# Patient Record
Sex: Female | Born: 1970 | Race: Black or African American | Hispanic: No | Marital: Married | Smoking: Never smoker
Health system: Southern US, Community
[De-identification: ages and names within clinical notes are randomized; demographics above are authoritative.]

## PROBLEM LIST (undated history)

## (undated) ENCOUNTER — Inpatient Hospital Stay (HOSPITAL_COMMUNITY): Payer: Self-pay

## (undated) DIAGNOSIS — R51 Headache: Secondary | ICD-10-CM

## (undated) DIAGNOSIS — Z8742 Personal history of other diseases of the female genital tract: Secondary | ICD-10-CM

## (undated) DIAGNOSIS — IMO0002 Reserved for concepts with insufficient information to code with codable children: Secondary | ICD-10-CM

## (undated) DIAGNOSIS — R011 Cardiac murmur, unspecified: Secondary | ICD-10-CM

## (undated) DIAGNOSIS — B9689 Other specified bacterial agents as the cause of diseases classified elsewhere: Secondary | ICD-10-CM

## (undated) DIAGNOSIS — G971 Other reaction to spinal and lumbar puncture: Secondary | ICD-10-CM

## (undated) DIAGNOSIS — N760 Acute vaginitis: Secondary | ICD-10-CM

## (undated) DIAGNOSIS — R6882 Decreased libido: Secondary | ICD-10-CM

## (undated) DIAGNOSIS — D649 Anemia, unspecified: Secondary | ICD-10-CM

## (undated) DIAGNOSIS — T7421XA Adult sexual abuse, confirmed, initial encounter: Secondary | ICD-10-CM

## (undated) DIAGNOSIS — Z8632 Personal history of gestational diabetes: Secondary | ICD-10-CM

## (undated) DIAGNOSIS — R569 Unspecified convulsions: Secondary | ICD-10-CM

## (undated) DIAGNOSIS — G43829 Menstrual migraine, not intractable, without status migrainosus: Secondary | ICD-10-CM

## (undated) DIAGNOSIS — R87619 Unspecified abnormal cytological findings in specimens from cervix uteri: Secondary | ICD-10-CM

## (undated) DIAGNOSIS — Z8639 Personal history of other endocrine, nutritional and metabolic disease: Secondary | ICD-10-CM

## (undated) DIAGNOSIS — Z87440 Personal history of urinary (tract) infections: Secondary | ICD-10-CM

## (undated) DIAGNOSIS — N9089 Other specified noninflammatory disorders of vulva and perineum: Secondary | ICD-10-CM

## (undated) DIAGNOSIS — N914 Secondary oligomenorrhea: Secondary | ICD-10-CM

## (undated) DIAGNOSIS — B373 Candidiasis of vulva and vagina: Secondary | ICD-10-CM

## (undated) DIAGNOSIS — Z8669 Personal history of other diseases of the nervous system and sense organs: Secondary | ICD-10-CM

## (undated) HISTORY — DX: Personal history of other diseases of the nervous system and sense organs: Z86.69

## (undated) HISTORY — DX: Menstrual migraine, not intractable, without status migrainosus: G43.829

## (undated) HISTORY — DX: Personal history of other endocrine, nutritional and metabolic disease: Z86.39

## (undated) HISTORY — DX: Personal history of gestational diabetes: Z86.32

## (undated) HISTORY — DX: Decreased libido: R68.82

## (undated) HISTORY — DX: Anemia, unspecified: D64.9

## (undated) HISTORY — DX: Acute vaginitis: N76.0

## (undated) HISTORY — DX: Other specified noninflammatory disorders of vulva and perineum: N90.89

## (undated) HISTORY — DX: Personal history of other diseases of the female genital tract: Z87.42

## (undated) HISTORY — DX: Personal history of urinary (tract) infections: Z87.440

## (undated) HISTORY — DX: Secondary oligomenorrhea: N91.4

## (undated) HISTORY — DX: Candidiasis of vulva and vagina: B37.3

## (undated) HISTORY — PX: GYNECOLOGIC CRYOSURGERY: SHX857

## (undated) HISTORY — DX: Other specified bacterial agents as the cause of diseases classified elsewhere: B96.89

---
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
## 1995-03-27 DIAGNOSIS — Z87440 Personal history of urinary (tract) infections: Secondary | ICD-10-CM

## 1995-03-27 DIAGNOSIS — B3731 Acute candidiasis of vulva and vagina: Secondary | ICD-10-CM

## 1995-03-27 DIAGNOSIS — B373 Candidiasis of vulva and vagina: Secondary | ICD-10-CM

## 1995-03-27 DIAGNOSIS — N9089 Other specified noninflammatory disorders of vulva and perineum: Secondary | ICD-10-CM

## 1995-03-27 HISTORY — DX: Other specified noninflammatory disorders of vulva and perineum: N90.89

## 1995-03-27 HISTORY — DX: Acute candidiasis of vulva and vagina: B37.31

## 1995-03-27 HISTORY — DX: Candidiasis of vulva and vagina: B37.3

## 1995-03-27 HISTORY — DX: Personal history of urinary (tract) infections: Z87.440

## 1996-01-03 DIAGNOSIS — D649 Anemia, unspecified: Secondary | ICD-10-CM

## 1996-01-03 DIAGNOSIS — N914 Secondary oligomenorrhea: Secondary | ICD-10-CM

## 1996-01-03 HISTORY — DX: Secondary oligomenorrhea: N91.4

## 1996-01-03 HISTORY — DX: Anemia, unspecified: D64.9

## 1998-08-26 ENCOUNTER — Other Ambulatory Visit: Admission: RE | Admit: 1998-08-26 | Discharge: 1998-08-26 | Payer: Self-pay | Admitting: Obstetrics and Gynecology

## 1999-03-27 DIAGNOSIS — Z8639 Personal history of other endocrine, nutritional and metabolic disease: Secondary | ICD-10-CM

## 1999-03-27 DIAGNOSIS — Z8742 Personal history of other diseases of the female genital tract: Secondary | ICD-10-CM

## 1999-03-27 HISTORY — DX: Personal history of other endocrine, nutritional and metabolic disease: Z86.39

## 1999-03-27 HISTORY — DX: Personal history of other diseases of the female genital tract: Z87.42

## 1999-08-31 ENCOUNTER — Other Ambulatory Visit: Admission: RE | Admit: 1999-08-31 | Discharge: 1999-08-31 | Payer: Self-pay | Admitting: Obstetrics and Gynecology

## 1999-09-19 ENCOUNTER — Encounter: Payer: Self-pay | Admitting: Obstetrics and Gynecology

## 1999-09-19 ENCOUNTER — Ambulatory Visit (HOSPITAL_COMMUNITY): Admission: RE | Admit: 1999-09-19 | Discharge: 1999-09-19 | Payer: Self-pay | Admitting: Obstetrics and Gynecology

## 1999-10-23 ENCOUNTER — Emergency Department (HOSPITAL_COMMUNITY): Admission: EM | Admit: 1999-10-23 | Discharge: 1999-10-23 | Payer: Self-pay

## 1999-10-31 ENCOUNTER — Encounter: Payer: Self-pay | Admitting: Family Medicine

## 1999-10-31 ENCOUNTER — Ambulatory Visit (HOSPITAL_COMMUNITY): Admission: RE | Admit: 1999-10-31 | Discharge: 1999-10-31 | Payer: Self-pay | Admitting: Family Medicine

## 2000-03-26 DIAGNOSIS — B9689 Other specified bacterial agents as the cause of diseases classified elsewhere: Secondary | ICD-10-CM

## 2000-03-26 HISTORY — DX: Other specified bacterial agents as the cause of diseases classified elsewhere: B96.89

## 2000-04-05 ENCOUNTER — Other Ambulatory Visit: Admission: RE | Admit: 2000-04-05 | Discharge: 2000-04-05 | Payer: Self-pay | Admitting: Obstetrics and Gynecology

## 2000-07-02 ENCOUNTER — Encounter: Admission: RE | Admit: 2000-07-02 | Discharge: 2000-09-30 | Payer: Self-pay | Admitting: Obstetrics and Gynecology

## 2000-08-21 ENCOUNTER — Encounter: Payer: Self-pay | Admitting: Obstetrics and Gynecology

## 2000-08-21 ENCOUNTER — Inpatient Hospital Stay (HOSPITAL_COMMUNITY): Admission: AD | Admit: 2000-08-21 | Discharge: 2000-08-21 | Payer: Self-pay | Admitting: Obstetrics and Gynecology

## 2000-08-28 ENCOUNTER — Inpatient Hospital Stay (HOSPITAL_COMMUNITY): Admission: AD | Admit: 2000-08-28 | Discharge: 2000-08-31 | Payer: Self-pay | Admitting: Obstetrics and Gynecology

## 2000-10-09 ENCOUNTER — Other Ambulatory Visit: Admission: RE | Admit: 2000-10-09 | Discharge: 2000-10-09 | Payer: Self-pay | Admitting: Obstetrics and Gynecology

## 2005-02-19 ENCOUNTER — Other Ambulatory Visit: Admission: RE | Admit: 2005-02-19 | Discharge: 2005-02-19 | Payer: Self-pay | Admitting: Obstetrics and Gynecology

## 2005-03-26 DIAGNOSIS — R6882 Decreased libido: Secondary | ICD-10-CM

## 2005-03-26 HISTORY — DX: Decreased libido: R68.82

## 2006-02-20 ENCOUNTER — Other Ambulatory Visit: Admission: RE | Admit: 2006-02-20 | Discharge: 2006-02-20 | Payer: Self-pay | Admitting: Obstetrics and Gynecology

## 2006-03-26 DIAGNOSIS — N760 Acute vaginitis: Secondary | ICD-10-CM

## 2006-03-26 HISTORY — DX: Acute vaginitis: N76.0

## 2008-06-09 ENCOUNTER — Encounter: Admission: RE | Admit: 2008-06-09 | Discharge: 2008-06-09 | Payer: Self-pay | Admitting: Obstetrics and Gynecology

## 2008-07-01 ENCOUNTER — Inpatient Hospital Stay (HOSPITAL_COMMUNITY): Admission: RE | Admit: 2008-07-01 | Discharge: 2008-07-04 | Payer: Self-pay | Admitting: Obstetrics and Gynecology

## 2009-10-20 ENCOUNTER — Emergency Department (HOSPITAL_COMMUNITY): Admission: EM | Admit: 2009-10-20 | Discharge: 2009-10-20 | Payer: Self-pay | Admitting: Emergency Medicine

## 2010-06-10 LAB — POCT I-STAT, CHEM 8
BUN: 11 mg/dL (ref 6–23)
Calcium, Ion: 1.12 mmol/L (ref 1.12–1.32)
Chloride: 105 meq/L (ref 96–112)
Creatinine, Ser: 0.9 mg/dL (ref 0.4–1.2)
Glucose, Bld: 110 mg/dL — ABNORMAL HIGH (ref 70–99)
HCT: 40 % (ref 36.0–46.0)
Hemoglobin: 13.6 g/dL (ref 12.0–15.0)
Potassium: 3.4 mEq/L — ABNORMAL LOW (ref 3.5–5.1)
Sodium: 140 meq/L (ref 135–145)
TCO2: 25 mmol/L (ref 0–100)

## 2010-06-10 LAB — CBC
HCT: 38 % (ref 36.0–46.0)
Hemoglobin: 12.6 g/dL (ref 12.0–15.0)
MCH: 27.7 pg (ref 26.0–34.0)
MCHC: 33.2 g/dL (ref 30.0–36.0)
MCV: 83.3 fL (ref 78.0–100.0)
Platelets: 309 10*3/uL (ref 150–400)
RBC: 4.56 MIL/uL (ref 3.87–5.11)
RDW: 13.8 % (ref 11.5–15.5)
WBC: 10.8 10*3/uL — ABNORMAL HIGH (ref 4.0–10.5)

## 2010-06-10 LAB — DIFFERENTIAL
Eosinophils Absolute: 0.1 10*3/uL (ref 0.0–0.7)
Lymphs Abs: 4.8 10*3/uL — ABNORMAL HIGH (ref 0.7–4.0)
Monocytes Relative: 5 % (ref 3–12)
Neutro Abs: 5.2 10*3/uL (ref 1.7–7.7)
Neutrophils Relative %: 48 % (ref 43–77)

## 2010-06-10 LAB — POCT CARDIAC MARKERS
CKMB, poc: <1 ng/mL — ABNORMAL LOW (ref 1.0–8.0)
CKMB, poc: <1 ng/mL — ABNORMAL LOW (ref 1.0–8.0)
Myoglobin, poc: 42.8 ng/mL (ref 12–200)
Myoglobin, poc: 65.8 ng/mL (ref 12–200)
Troponin i, poc: <0.05 ng/mL (ref 0.00–0.09)
Troponin i, poc: <0.05 ng/mL (ref 0.00–0.09)

## 2010-07-05 LAB — CBC
Hemoglobin: 10.2 g/dL — ABNORMAL LOW (ref 12.0–15.0)
Hemoglobin: 8.3 g/dL — ABNORMAL LOW (ref 12.0–15.0)
MCHC: 32.3 g/dL (ref 30.0–36.0)
MCHC: 32.8 g/dL (ref 30.0–36.0)
MCV: 84.9 fL (ref 78.0–100.0)
MCV: 85.4 fL (ref 78.0–100.0)
RBC: 2.95 MIL/uL — ABNORMAL LOW (ref 3.87–5.11)
RBC: 3.73 MIL/uL — ABNORMAL LOW (ref 3.87–5.11)
WBC: 10 10*3/uL (ref 4.0–10.5)
WBC: 10.6 10*3/uL — ABNORMAL HIGH (ref 4.0–10.5)

## 2010-07-05 LAB — RPR: RPR Ser Ql: NONREACTIVE

## 2010-07-05 LAB — BASIC METABOLIC PANEL
CO2: 23 mEq/L (ref 19–32)
Chloride: 106 mEq/L (ref 96–112)
Creatinine, Ser: 0.62 mg/dL (ref 0.4–1.2)
GFR calc Af Amer: >60 mL/min (ref >60)
Potassium: 3.7 mEq/L (ref 3.5–5.1)
Sodium: 135 mEq/L (ref 135–145)

## 2010-07-05 LAB — GLUCOSE, CAPILLARY: Glucose-Capillary: 65 mg/dL — ABNORMAL LOW (ref 70–99)

## 2010-07-05 LAB — CCBB MATERNAL DONOR DRAW

## 2010-08-08 NOTE — Discharge Summary (Signed)
Lori Watts, Lori Watts           ACCOUNT NO.:  [id number]   MEDICAL RECORD NO.:  [id number]          PATIENT TYPE:  INP   LOCATION:  9103                          FACILITY:  [facility]   PHYSICIAN:  Osborn Coho, M.D.   DATE OF BIRTH:  Mar 15, 1971   DATE OF ADMISSION:  07/01/2008  DATE OF DISCHARGE:  07/04/2008                               DISCHARGE SUMMARY   ADMITTING DIAGNOSES:  1. Intrauterine pregnancy at 51 weeks' gestation.  2. Previous cesarean section with desire for repeat.  3. Gestational diabetes.  4. Advanced maternal age.   DISCHARGE DIAGNOSES:  1. Intrauterine pregnancy at 76 weeks' gestation.  2. Previous cesarean section with desire for repeat.  3. Gestational diabetes.  4. Advanced maternal age.  5. Status post a repeat low transverse cesarean section with findings,      viable female infant born on July 01, 2007 at 1406 p.m.  A female      weighing 7 pounds 12 ounces (3500g) length 19.75 inches, Apgars 8      at 1 minute and 9 at 5 minutes.  6. Postpartum anemia.  7. Bottle feeding with some breastfeeding.  8. Headache x24 hours without relief with pain medications with a      history of migraines.  9. Gestational diabetes with a normal fasting blood sugar this      morning.   HOSPITAL PROCEDURES:  1. Spinal anesthesia.  2. Repeat low transverse cesarean section.  3. Lysis of adhesions.  4. Repair of the uterine fundal defect.  5. Subfascial drain placement.   Lori Watts is a 40 year old gravida 3, para 1-0-0-3, who presented for  a repeat low transverse cesarean section on date of admission.  Pregnancy had been followed by Dr. Pennie Rushing.  Pregnancy remarkable for:  1. Advanced maternal age.  2. Long cycles.  3. Migraines.  4. Previous C-section x2.  5. History of gestational diabetes.  6. History of urinary tract infections.  7. History of cryo.  8. 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  9. Medication allergies.  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   On her date of admission, she was afebrile.  Her vital signs were  stable.  Fetal heart rate was in 130s, positive fetal movement.  Blood  glucose that morning was 53.  Denies any dizziness.  After admission,  the patient did continue to verbalize desire for a repeat low transverse  cesarean section and was prepped and taken to the OR, where procedure  was performed by Dr. Dierdre Forth with Eulogio Bear, CNM assisting.  A repeat low transverse cesarean section was performed under spinal  anesthesia.  She also had procedures of lysis of adhesions, repair of  uterine fundal defect, as well as subfascial drain placement.  Estimated  blood loss was approximately 1500 mL.  Please see Dr. Lilian Coma  operative note for any further notations regarding the operation.  The  patient was taken to the recovery room after surgery in satisfactory  condition and the infant did go to Full-Term Nursery.  By postoperative  day #1; the patient stated that she was very weak, she had  some  dizziness with out of bed x2, no syncope, slight nausea, was tolerating  a regular diet, positive flatus, no bowel movement.  She was  breastfeeding as well as bottle feeding and did decline blood  transfusion.  She was afebrile.  Vital signs were stable.  Labs on  postop day #1; white count was 10.6, that was up from 10.  On admission,  hemoglobin was down to 8.3 from 10.2.  On admission, hematocrit was down  to 25.2 from 31.7.  Platelets were 191, which was down from 239.  Her  physical exam was within normal limits.  She was generally weak, but  alert and oriented x3.  JP drain had bloody drainage.  She had had 70 mL  out since her surgery.  Lungs were clear bilaterally.  Abdomen, soft,  appropriately tender.  Fundus was firm below umbilicus.  Her postop  dressing was clean, dry, and intact.  She had a scant rubra lochia, no  edema, and negative Homans.  The patient was started on Nu-Iron b.i.d.  She did have  orthostatic vital signs done that day, which were as  follows.  Lying down; blood pressure 87/58, heart rate 58.  Sitting was  98/62, heart rate 66.  Standing was 99/65 and heart rate was 76.  Routine post C-section care continued.  By day #2, she reported feeling  lot better than the previous day.  No further dizziness, and has  complained of fatigue.  No syncope.  She was voiding without difficulty,  tolerating regular diet.  Still negative BM, but positive flatus.  Bottle feeding primarily.  She remained afebrile, and vital signs were  stable.  Physical exam was within normal limits.  Abdomen was soft,  appropriately tender.  Did have bowel sounds present x4 quadrants.  Fundus was firm below umbilicus.  Abdominal dressing remained clean,  dry, and intact.  She had about 80 mL of output from her JP drain, the  previous 24 hours.  Lochia remained unchanged and extremities were  unchanged.  Postpartum care continued with anticipation of discharge the  next day.  On postoperative day #3; she was doing okay, but ready for  discharge.  She did complain of headaches since yesterday that was not  relieved by Motrin or Percocet.  She does have a history of migraines.  She is primarily bottle feeding, reported good latch on the left but  that baby does not latch very well on the right, continued tolerating  her diet and voiding spontaneously.  She does report that she desires a  future pregnancy and plan will be Depo before discharge today and then  discuss oral contraceptive pills with Dr. Pennie Rushing at her postpartum  appointment.  She has continued weakness and fatigue, but no syncope.  Still no bowel movement and that has been since this past Wednesday.  Vital signs remained stable.  She is afebrile.  Her CBG this morning is  65.  Her JP drainage drained approximately 52 mL over the last 24 hours  with serosanguineous drainage.  Her physical exam was still within  normal limits.  JP drain was  discontinued without difficulty, tip  visualized.  She has Steri-Strips which are clean, dry and intact across  the incision.  Her abdomen is soft.  It is appropriately tender.  Fundus  is firm, -2U, and her extremities are within normal limits.  The patient  was deemed to received full benefit of her hospital stay and is  discharged home  stable condition on postoperative day #3.  She is to  receive an anesthesia consult prior to discharge to also assess her  headache to rule out spinal headache or any other anesthesia  complications.   DISCHARGE INSTRUCTIONS:  Per [facility] pamphlet.  Warning signs and symptoms  to report were reviewed.   DISCHARGE FOLLOWUP:  Occur in 6 weeks or p.r.n. at [facility].  She does report she already has an appointment made in May with Dr.  Pennie Rushing.   DISCHARGE MEDICATIONS:  Motrin 600 mg p.o. q.6 h. p.r.n. pain.  Percocet  5/325 one to 2 tablets p.o. q.4-6 h. p.r.n. moderate-to-severe pain.  Iron supplement 1 tablet p.o. b.i.d. with meals.  Colace 2 tablets p.o.  daily while on  pain medication.  Prenatal vitamin 1 tablet p.o. daily.  She does not  intend to continue her calcium supplement each day.  Also encouraged to  obtain MiraLax to take if no bowel movements for 3 days,  otherwise to  get back today as well.  She is to receive Depo-Provera 150 mg IM x1  prior to discharge.      Candice Greencastle, [place]      Osborn Coho, M.D.  Electronically Signed    CHS/MEDQ  D:  07/04/2008  T:  07/05/2008  Job:  [id number]

## 2010-08-08 NOTE — Op Note (Signed)
Lori Watts, Lori Watts           ACCOUNT NO.:  [id number]   MEDICAL RECORD NO.:  [id number]          PATIENT TYPE:  INP   LOCATION:  9103                          [facility]   PHYSICIAN:  Hal Morales, M.D.DATE OF BIRTH:  1970-08-08   DATE OF PROCEDURE:  07/01/2008  DATE OF DISCHARGE:                               OPERATIVE REPORT   PREOPERATIVE DIAGNOSES:  1. Intrauterine pregnancy at term.  2. Prior cesarean section x2.  3. Desires repeat cesarean section.  4. Gestational diabetes.   POSTOPERATIVE DIAGNOSES:  1. Intrauterine pregnancy at term.  2. Prior cesarean section x2.  3. Desires repeat cesarean section.  4. Gestational diabetes.  5. Extensive pelvic adhesions.   PROCEDURES:  1. Repeat low-transverse cesarean section.  2. Lysis of adhesions.  3. Repair of uterine fundal defect.  4. Subfascial drain placement.   SURGEON:  Hal Morales, MD   FIRST ASSISTANT:  Eulogio Bear, CNM   ANESTHESIA:  Spinal.   ESTIMATED BLOOD LOSS:  1500 mL.   COMPLICATIONS:  None.   FINDINGS:  The patient was delivered of a female infant whose name is  Venia Carbon, weighing 7 pounds 13 ounces with Apgars of 8 and 9 at 1 and 5  minutes respectively.  The placenta contained an eccentrically inserted  3-vessel cord.  The uterus was densely adherent to the anterior  abdominal wall.  The tubes and ovaries could not be identified.   DESCRIPTION OF PROCEDURE:  The patient was taken to the operating room,  after appropriate identification, placed on the operating table.  After  the placement of spinal anesthetic, she was placed in the supine  position with a left lateral tilt.  The abdomen and perineum were  prepped with multiple layers of Betadine and a Foley catheter inserted  into the bladder and connected to straight drainage.  The abdomen was  draped as a sterile field.  After assurance of adequate surgical  anesthesia, the suprapubic region at the site of the previous  cesarean  section incision was then infiltrated with 20 mL of 0.25% Marcaine.  An  incision was made at that site and the abdomen opened in layers.  The  fascia was incised on either side and the rectus muscles bluntly  dissected off the abdominal fascia.  An attempt was made to enter the  peritoneum and it was noted that the peritoneum was densely adherent to  the anterior uterus along its entire uterine surface.  At that point, a  site of entry into the peritoneal cavity just above the bladder was  identified and widened.  The bladder blade was placed.  Lysis of  adhesions across the anterior lower uterine segment was undertaken to  identify the bladder reflection.  The bladder reflection was incised and  the bladder bluntly dissected off the anterior lower uterine segment.  The lower uterine segment was then incised transversely and that  incision taken laterally on either side bluntly.  The infant was  delivered from the occiput transverse position with the aid of a kiwi  vacuum extractor.  The infant was noted to have a nuchal cord  and body  cord.  The nares and pharynx were suctioned and the cord clamped and  cut.  The infant was handed off to the awaiting pediatricians.  The  placenta was initially allowed to detach, but the last attachment of the  placenta broken down bluntly with the surgeon's hand.  The placenta was  then removed from the operative field and the uterine cavity curetted  with a banjo curette revealing no additional portions of placenta.  The  uterine incision was closed with running interlocking suture of 0  Vicryl.  An imbricating suture of 0 Vicryl was placed.  Hemostasis there  was noted to be adequate; however, there was noted to be a significant  defect in the anterior fundus of the uterus, which had been densely  adherent to the anterior abdominal wall.  The defect edges were freed  from the overlying anterior abdominal wall and the edges reapproximated   with running suture of 0 Vicryl, then a layer of figure-of-eight sutures  of 0 Vicryl to close the defect completely and allow for adequate  hemostasis.  Copious irrigation was carried out and it was noted that in  order to achieve the delivery, small incisions in the medial surfaces of  each rectus muscle had been made.  These were reapproximated with figure-  of-eight sutures of 0 Vicryl.  The abdominal fascia was then closed with  a running suture of 0 Vicryl after the placement of a subfascial 7-mm  Jackson-Pratt drain through a stab wound in the right lower quadrant.  The fascia was further closed with figure-of-eight sutures on either  side of midline for reinforcement.  The subcutaneous tissue was  irrigated and made hemostatic with Bovie cautery.  A 10 mL of 0.25%  Marcaine was injected into the subcutaneous tissue and a subcuticular  suture of 3-0 Monocryl used to close the skin incision.  Steri-Strips  were applied and a sterile dressing applied.  The grenade was attached  to the 7-mm Jackson-Pratt drain and it was sewn in with a suture of 0  silk.  The patient was then taken from the operating room to the  recovery room in satisfactory condition having tolerated the procedure  well with sponge and instrument counts correct.  The infant went to the  full-term nursery.      Hal Morales, M.D.  Electronically Signed     VPH/MEDQ  D:  07/01/2008  T:  07/02/2008  Job:  [id number]
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
## 2010-08-11 NOTE — Discharge Summary (Signed)
[facility]  Patient:    Lori Watts, Lori Watts                  MRN: [id number] Adm. Date:  [id number] Disc. Date: 08/31/00 Attending:  Shaune Spittle Dictator:   Wynelle Bourgeois, C.N.M.                           Discharge Summary  ADMISSION DIAGNOSES:          1. Intrauterine pregnancy at term.                               2. Prior cesarean section, desires repeat.  DISCHARGE DIAGNOSES:          1. Intrauterine pregnancy at term.                               2. Prior cesarean section, desires repeat.                               3. Status post repeat low transverse cesarean                                  section, female infant weighing 8 pounds                                  14 ounces, Apgars 9 and 9.  HOSPITAL PROCEDURES:          1. Repeat low transverse cesarean section.                               2. Spinal anesthesia.  COMPLICATIONS:                Bleeding from uterine adhesion sites.  HOSPITAL COURSE:              The patient is a G3, para 0-2-0-2 at term who presented for scheduled C-section as an elective procedure. She was admitted on August 28, 2000  to the operating room and received spinal anesthesia. She underwent a low transverse cesarean section by Dr. Pennie Rushing for a viable female infant weighing 8 pounds 14 ounces, Apgars 9 and 9. She did well in recovery and the only complication during the surgery was bleeding from uterine adhesion sites.  Her postoperative course was essentially unremarkable. On postoperative day #1, she was ambulating well, tolerating a diet, and vital signs were stable. Chest was clear. Heart: regular rate and rhythm. Beasts soft and nontender. Abdominal incision was clear with a JP drain which had scant serosanguineous drainage. She had some postpartum anemia with a hemoglobin of 8.5. She was breast-feeding well. Her second and third postoperative days were uneventful except for onset of a severe  headache on postoperative day #2 and #3 which will be evaluated by anesthesia prior to discharge. She is breast-feeding well. Her vital signs have been stable. Her maximum temperature has been 98.6, blood pressure is 100-120/68-84. Physical exam is within normal limits. Incision is clean and dry. Staples will be removed today. Her JP drain will be removed today. She  is voiding sufficiently. Her extremities are within normal limits, and on postoperative day #3 she was deemed to have received full benefit of her hospital stay and was discharged home.  DISCHARGE LABORATORIES:       Her discharge labs were remarkable for hemoglobin of 8.5. Other labs were within normal limits.  DISCHARGE MEDICATIONS:        1. Micronor for contraception.                               2. Motrin 600 mg p.o. q.6h. p.r.n. cramping.                               3. Tylox one to two p.o. q.4h. p.r.n. pain.                               4. Over-the-counter iron sulfate twice a day                                  at home.  DISCHARGE INSTRUCTIONS:       Her discharge instructions are per [facility] handout.  DISCHARGE FOLLOWUP:           The patient is to follow up in six weeks or p.r.n. at [facility] as needed. DD:  08/31/00 TD:  08/31/00 Job: [id number]

## 2010-08-11 NOTE — H&P (Signed)
[facility]  Patient:    Lori Watts                  MRN: [id number] Adm. Date:  [id number] Attending:  Shaune Spittle Dictator:   Cam Hai, C.N.M.                         History and Physical  DATE OF BIRTH:                1970-04-27.  HISTORY:                      Ms. Lori Watts is a gravida 2, para 0-2-0-2, at term who presents for a scheduled cesarean section as an elective procedure. Her pregnancy has been followed by the [facility] OB/GYN M.D. service and has been remarkable for (1) irregular cycles, (2) history of twins, (30 history of preterm delivery, (4) history of HSV, (5) history of abnormal Pap smear and cryosurgery, (6) first trimester spotting, (7) secondary infertility, an d (8) gestational diabetes mellitus.  PRENATAL LABORATORY DATA:     Blood type A positive, antibody screen negative, sickle cell trait negative, RPR nonreactive, rubella immune, hepatitis B surface antigen negative, Pap smear within normal limits, gonorrhea negative, Chlamydia negative, maternal serum alphafetoprotein within normal limits. One-hour glucola was 148 and her three-hour glucose tolerance test was: fasting blood sugar 97, one-hour 248, two-hour 279, and three-hour 175 which is abnormal.  HISTORY OF PRESENT PREGNANCY: She presented for care at approximately [redacted] weeks gestation where she had moved from [place] and was transferring her OB care. She had an ultrasound at approximately [redacted] weeks gestation which gave her a new estimated date of confinement of September 10, 2000. At approximately [redacted] weeks gestation she decided she would like a repeat cesarean section, and she signed the consent form. At 27-1/2 weeks she had her one-hour glucola testing which was abnormal and her three-hour glucola tolerance test was scheduled for 29 weeks and was abnormal as well. At 32 weeks she had a urinary tract infection and the rest of her prenatal  care routine.  OBSTETRIC HISTORY:            In November of 1990, she had a delivery of twins, first twin was a female infant, delivered vaginally named Lori Watts with no anesthesia after four hours in labor at 34-1/[redacted] weeks gestation; this infant weighed 4 pounds even. The other infant was a female who was delivered by C-section under general anesthesia due to a cord around his neck. His named was Lori Watts and he weighed 4 pounds and 9 ounces. Her second pregnancy is the present pregnancy. She reports being anemic with the first pregnancy.  PAST MEDICAL HISTORY:         She took oral contraceptives in the past but she stopped in August of 2000. She had an abnormal Pap smear and cryosurgery in 1991. She reports secondary infertility after stopping Depo-Provera. She had gonorrhea in 1987 and HSV diagnosed in 1997. She reports having infrequent yeast infections. She reports having had the usual childhood illnesses and has no medication allergies. She reports having recurrent constipation with hemorrhoids, frequent urinary tract infections, and migraine headaches. She had knee surgery bilaterally in 1998, her C-section in 1990, and cryosurgery in 1991.  FAMILY HISTORY:               Significant for maternal grandmother with myocardial infarction, mother and  maternal grandmother with hypertension, mother with Type 2 diabetes and a first cousin with hyperthyroidism, maternal grandmother with a history of a CVA, multiple relatives with drug addiction.  GENETIC HISTORY:              Significant for maternal first cousin with cerebral palsy, two of her maternal grandmothers sisters had twins, the patient has had twins.  SOCIAL HISTORY:               She is married to Rankin who is involved and supportive. They are of the [facility] faith. They both are college educated, and deny any alcohol, smoking, or illicit drug use with the pregnancy.  OBJECTIVE DATA:  VITAL SIGNS:                  Vital signs  are stable, she is afebrile.  HEENT:                        Within normal limits.  CHEST:                        Clear to auscultation.  HEART:                        Regular to rate and rhythm.  BREASTS:                      Soft and nontender.  ABDOMEN:                      Gravid in contour with rare uterine contractions.  FETAL HEART RATE:             Fetal heart rate is reactive and reassuring.  ASSESSMENT:                   1. Intrauterine pregnancy at term.                               2. Desires repeat cesarean section.                               3. Group B streptococcus negative.  PLAN:                         Proceed with repeat cesarean section by Dr. Dierdre Forth. DD:  08/29/00 TD:  08/29/00 Job: [id number]

## 2010-08-11 NOTE — Op Note (Signed)
[facility]  Patient:    Lori Watts, Lori Watts Visit Number: [id number] MRN: [id number]          Service Type: OBS Location: 910A [id number] [id number] Attending Physician:  Shaune Spittle Proc. Date: 08/28/00 Admit Date:  08/28/2000                             Operative Report  PREOPERATIVE DIAGNOSIS:       Intrauterine pregnancy at term, prior cesarean section with desire for repeat cesarean section.  POSTOPERATIVE DIAGNOSIS:      Intrauterine pregnancy at term, prior cesarean section with desire for repeat cesarean section. Plus macrosomia and extensive pelvic adhesions.  OPERATION:                    Repeat low transverse cesarean section.  SURGEON:                      Vanessa P. Pennie Rushing, M.D.  ASSISTANT:                    Mack Guise, C.N.M.  ANESTHESIA:                   Spinal.  ESTIMATED BLOOD LOSS:         1000 cc.  COMPLICATIONS:                Significant bleeding from uterine serosal sites where adhesions were lysed.  FINDINGS:                     The patient was delivered of a female infant weighing 8 pounds 14 ounces with Apgars of 9 and 9 at one and five minutes, respectively.  The tubes and ovaries could not be evaluated because of the dense adhesions between the anterior uterus and the anterior abdominal wall.  DESCRIPTION OF PROCEDURE:     The patient was taken to the operating room after appropriate identification and placed on the operating table.  After placement of a spinal anesthetic she was placed in the supine position with a left lateral tilt.  The abdomen and perineum were prepped with multiple layers of Betadine and a Foley catheter inserted into the bladder under sterile conditions and connected to straight drainage.  The abdomen was draped as a sterile field.  After assurance of adequate anesthesia a transverse incision was made at the site of the previous cesarean section incision and the abdomen opened in  layers.  The peritoneum was entered with some difficulty because of significant scarring and identification of the peritoneum was difficult.  Once the peritoneum had been entered, lysis of adhesions was undertaken to allow access to the lower uterine segment.  Once extensive lysis of adhesions had occurred, the bladder blade was placed and the uterus incised approximately 1 cm above the uterovesical fold.  The infant was delivered from the occipitotransverse position with the aid of a Mity-Vac vacuum extractor and after having the nares and pharynx suctioned and the cord clamped and cut, was handed off to the awaiting pediatricians.  The appropriate cord blood was drawn and the placenta noted to separate from the uterus and was removed from the uterus with gentle traction.  The uterine incision was closed with a running interlocking suture of 0 Vicryl.  An imbricating suture of 0 Vicryl was placed for adequate hemostasis at the incision line.  It  was noted at that time, however, that a number of the areas on the anterior uterus where the serosa had been dissected off the anterior abdominal wall were bleeding significantly.  Numerous figure-of-eight sutures of 0 Vicryl were placed initially with adequate hemostasis.  Bleeding from other areas of lysis of adhesions required the use of Avitene for hemostasis.  Copious irrigation had been carried out prior to this and once hemostasis was noted to be adequate, the abdominal peritoneum was brought together in the midline in a figure-of-eight suture and the rectus muscles brought together in the midline with figure-of-eight suture.  A subfascial Jackson-Pratt drain was then placed through the fascia and the skin with the fascia being closed subsequent to that in a running fashion with 0 Vicryl and then reinforced on either side of the midline with figure-of-eight sutures of 0 Vicryl.  The subcutaneous tissue was noted to be hemostatic and was  irrigated.  Skin staples were applied to the skin incision.  The Jackson-Pratt drain was sewn in with a suture of 2-0 silk.  A sterile dressing was applied and the patient was taken from the operating room to the recovery room in satisfactory condition having tolerated the procedure well with sponge and instrument counts correct.  The infant went to the fullterm nursery. Attending Physician:  Shaune Spittle DD:  08/28/00 TD:  08/28/00 Job: [id number]

## 2011-08-21 ENCOUNTER — Encounter (HOSPITAL_COMMUNITY): Payer: Self-pay | Admitting: *Deleted

## 2011-08-21 ENCOUNTER — Inpatient Hospital Stay (HOSPITAL_COMMUNITY)
Admission: AD | Admit: 2011-08-21 | Discharge: 2011-08-21 | Disposition: A | Discharge status: Home / Self Care | Payer: 59 | Source: Ambulatory Visit | Attending: Obstetrics and Gynecology | Admitting: Obstetrics and Gynecology

## 2011-08-21 DIAGNOSIS — N925 Other specified irregular menstruation: Secondary | ICD-10-CM | POA: Insufficient documentation

## 2011-08-21 DIAGNOSIS — N949 Unspecified condition associated with female genital organs and menstrual cycle: Secondary | ICD-10-CM | POA: Insufficient documentation

## 2011-08-21 DIAGNOSIS — R109 Unspecified abdominal pain: Secondary | ICD-10-CM | POA: Insufficient documentation

## 2011-08-21 DIAGNOSIS — N938 Other specified abnormal uterine and vaginal bleeding: Secondary | ICD-10-CM | POA: Insufficient documentation

## 2011-08-21 DIAGNOSIS — O200 Threatened abortion: Secondary | ICD-10-CM

## 2011-08-21 HISTORY — DX: Reserved for concepts with insufficient information to code with codable children: IMO0002

## 2011-08-21 HISTORY — DX: Unspecified abnormal cytological findings in specimens from cervix uteri: R87.619

## 2011-08-21 HISTORY — DX: Headache: R51

## 2011-08-21 HISTORY — DX: Adult sexual abuse, confirmed, initial encounter: T74.21XA

## 2011-08-21 HISTORY — DX: Other reaction to spinal and lumbar puncture: G97.1

## 2011-08-21 LAB — WET PREP, GENITAL
Clue Cells Wet Prep HPF POC: NONE SEEN
Trich, Wet Prep: NONE SEEN

## 2011-08-21 LAB — CBC
Hemoglobin: 11.1 g/dL — ABNORMAL LOW (ref 12.0–15.0)
MCV: 80.3 fL (ref 78.0–100.0)
Platelets: 384 10*3/uL (ref 150–400)
RBC: 4.26 MIL/uL (ref 3.87–5.11)
WBC: 10.4 10*3/uL (ref 4.0–10.5)

## 2011-08-21 LAB — HCG, QUANTITATIVE, PREGNANCY: hCG, Beta Chain, Quant, S: 5 m[IU]/mL — ABNORMAL HIGH (ref <5)

## 2011-08-21 MED ORDER — IBUPROFEN 200 MG PO TABS: 600.0000 mg | ORAL_TABLET | Freq: Four times a day (QID) | ORAL | Status: AC | PRN

## 2011-08-21 MED ORDER — IBUPROFEN 200 MG PO TABS: 600.0000 mg | ORAL_TABLET | Freq: Four times a day (QID) | ORAL | Status: DC | PRN

## 2011-08-21 MED ORDER — KETOROLAC TROMETHAMINE 30 MG/ML IJ SOLN
30.0000 mg | Freq: Once | INTRAMUSCULAR | Status: AC
  Administered 2011-08-21: 30 mg via INTRAVENOUS
  Filled 2011-08-21: qty 1
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
## 2011-08-21 NOTE — MAU Note (Signed)
Cramping started 2 days ago, woke up this morning had bleeding like a period.  First appt in office 06/13
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
## 2011-08-28 ENCOUNTER — Ambulatory Visit (INDEPENDENT_AMBULATORY_CARE_PROVIDER_SITE_OTHER): Payer: 59 | Admitting: Obstetrics and Gynecology

## 2011-08-28 ENCOUNTER — Encounter: Payer: Self-pay | Admitting: Obstetrics and Gynecology

## 2011-08-28 VITALS — BP 114/72 | Ht 66.0 in | Wt 166.0 lb

## 2011-08-28 DIAGNOSIS — O039 Complete or unspecified spontaneous abortion without complication: Secondary | ICD-10-CM

## 2011-08-28 DIAGNOSIS — N939 Abnormal uterine and vaginal bleeding, unspecified: Secondary | ICD-10-CM

## 2011-08-28 DIAGNOSIS — N898 Other specified noninflammatory disorders of vagina: Secondary | ICD-10-CM

## 2011-08-28 NOTE — Progress Notes (Signed)
Pt seen at [facility] 08/21/11 for heavy bleeding and clotting pt states positive preg test was told at MAU having a miscarriage f/u with obgyn pt states stop bleeding 08/26/12.  Pt without c/o no abdominal pain or any further bleeding.   BP 114/72  Ht 5\' 6"  (1.676 m)  Wt 166 lb (75.297 kg)  BMI 26.79 kg/m2  LMP 07/11/2011 Physical Examination: General appearance - alert, well appearing, and in no distress and crying Heart - normal rate and regular rhythm Abdomen - soft, nontender, nondistended, no masses or organomegaly Pelvic - normal external genitalia, vulva, vagina, cervix, uterus and adnexa, VULVA: normal appearing vulva with no masses, tenderness or lesions, VAGINA: normal appearing vagina with normal color and discharge, no lesions, CERVIX: normal appearing cervix without discharge or lesions, long and closed, UTERUS: uterus is normal size, shape, consistency and nontender, ADNEXA: normal adnexa in size, nontender and no masses Neurological - alert, oriented, normal speech, no focal findings or movement disorder noted Exam c/w SAB BHCG 5 on 5/28 recheck today RT 10/13 for AEX

## 2011-08-30 ENCOUNTER — Telehealth: Payer: Self-pay

## 2011-08-30 DIAGNOSIS — Z3490 Encounter for supervision of normal pregnancy, unspecified, unspecified trimester: Secondary | ICD-10-CM

## 2011-08-30 NOTE — Telephone Encounter (Signed)
Spoke with pt rgd labs informed quant increased still pregnant need quant every week until 1500 then [place] pt has appt 09/05/11 at 9:00 for lab visit pt voice understanding

## 2011-08-30 NOTE — Telephone Encounter (Signed)
Message copied by Rolla Plate on Thu Aug 30, 2011 11:17 AM ------      Message from: Jaymes Graff      Created: Wed Aug 29, 2011 11:48 PM       Please make sure patient has an appointment to talk about her results. please tell pt her lab has increased, so she is  pregnant.   She needs a bhcg q week until it rises to 1500.  Once 1500 or greater, she needs an appt with an [place].  If she has any heavy bleeding or pain she should call the office.

## 2011-09-05 ENCOUNTER — Other Ambulatory Visit: Payer: 59

## 2011-09-06 ENCOUNTER — Telehealth: Payer: Self-pay | Admitting: Obstetrics and Gynecology

## 2011-09-06 NOTE — Telephone Encounter (Signed)
Niccole/call bck

## 2011-09-10 NOTE — Telephone Encounter (Signed)
Hey Dr.D can you call this pt please thank you

## 2011-09-10 NOTE — Telephone Encounter (Signed)
I reviewed the labs with the pt.  She understands that her labs are c/w a miscarriage and she should just have a follow up visit with me

## 2011-10-04 ENCOUNTER — Encounter: Payer: Self-pay | Admitting: Obstetrics and Gynecology

## 2011-10-04 ENCOUNTER — Ambulatory Visit (INDEPENDENT_AMBULATORY_CARE_PROVIDER_SITE_OTHER): Payer: 59 | Admitting: Obstetrics and Gynecology

## 2011-10-04 VITALS — BP 112/62 | Temp 98.0°F | Ht 65.0 in | Wt 167.0 lb

## 2011-10-04 DIAGNOSIS — Z98891 History of uterine scar from previous surgery: Secondary | ICD-10-CM | POA: Insufficient documentation

## 2011-10-04 DIAGNOSIS — N912 Amenorrhea, unspecified: Secondary | ICD-10-CM

## 2011-10-04 DIAGNOSIS — O039 Complete or unspecified spontaneous abortion without complication: Secondary | ICD-10-CM

## 2011-10-04 DIAGNOSIS — Z9889 Other specified postprocedural states: Secondary | ICD-10-CM

## 2011-10-04 DIAGNOSIS — G43829 Menstrual migraine, not intractable, without status migrainosus: Secondary | ICD-10-CM

## 2011-10-04 LAB — POCT URINE PREGNANCY: Preg Test, Ur: NEGATIVE

## 2011-10-04 MED ORDER — MEDROXYPROGESTERONE ACETATE 5 MG PO TABS: 5.0000 mg | ORAL_TABLET | Freq: Every day | ORAL | Status: DC

## 2011-10-04 NOTE — Patient Instructions (Addendum)
If no menses by July 27, do a home pregnancy test.  If that is negative, take Provera 5 mg daily by mouth for 10 days. If no menses by 2 weeks after completion of the Provera please call our office   Placenta Previa Placenta previa is a condition in which the placenta has grown low in the womb (uterus). This is a condition in which the organ which connects the fetus to the mother's uterus (placenta) is low in the opening in the uterus (cervix). It can partially or completely cover the cervix. The cause of this is unknown. It is more common with multiple births or twins. SYMPTOMS  The main symptom or sign of placenta previa is vaginal bleeding. The bleeding can be mild to very heavy. This condition can be very serious for the mother and baby. Often there are no symptoms with placenta previa. Sometimes if the location of the placenta is very low it will become partially detached and cause bleeding. This may be simply a marginal sinus separation of the placenta. This is a separation of the vessels from the wall of the uterus. This may cause no further problems other than mild anxiety. There is an increase risk of intrauterine growth restriction (IUGR) with placenta previa because of the abnormal placement of the placenta. DIAGNOSIS  The diagnosis is usually made by ultrasound exam of the uterus. There may be a careful vaginal exam to see the cervix. The patient will be prepared for a Cesarean section immediately if necessary. TREATMENT  Treatment for placenta previa is usually bed rest in the hospital or at home. You may be given medication to stop contractions. Contractions can increase bleeding. Your doctor may take fluid from the baby's sac (amniocentesis) to see if the baby's lungs are mature enough for a C-section. A blood transfusion may be necessary if you have a low blood count. No further treatment may be needed when placenta previa is present in small degrees. Early placenta previa may resolve on  it's own. The placenta moves higher in the birth canal as pregnancy progresses. In this case the placenta no longer is an obstruction to birth. The position of the placenta may need to be reconfirmed during pregnancy. This can be done with an ultrasound exam of the belly(abdomen). Call your caregiver immediately if blood loss is severe. Immediate fluid or blood replacement may be necessary. With complete placenta previa, the only way to safely deliver the baby is by Cesarean section. HOME CARE INSTRUCTIONS   Follow your caregiver's advice about bed rest.   Take any iron pills or other medications your doctor gives to you.   No bending or lifting.   Do not have sexual intercourse.   Do not put anything in your vagina (tampons or vaginal creams). If you are bleeding, use sanitary pads.   Keep your doctors appointments as scheduled. Not keeping the appointment could result in a chronic or permanent injury, pain, disability and injury or death to you or your unborn baby. If there is any problem keeping the appointment, you must call back to this facility for assistance.  SEEK IMMEDIATE MEDICAL CARE IF:   You have increased bleeding.   You have fainting episodes or feel lightheaded.   You develop abdominal pain.   You can no longer feel normal fetal or baby movements.   You develop uterine contractions.  Document Released: 03/12/2005 Document Revised: 03/01/2011 Document Reviewed: 10/24/2007 [facility] Patient Information 2012 [place], [place].

## 2011-10-04 NOTE — Progress Notes (Signed)
Subjective Pt here to f/u on Miscarriage from 08/21/2011. Not had cycle since then.  No cramping or abd pain.  Last pap 01/22/2011 The patient states that she wants a total of 7 children. She currently has 4 children having had 3 prior cesarean sections. She asks questions about the risks of more cesarean sections. She also asks about intentional conception of multiple. 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 She plans if she is able to conceive to take the semester off from law school to deal with what will be a high risk pregnancy.  Objective: BP 112/62  Temp 98 F (36.7 C) (Oral)  Ht 5\' 5"  (1.651 m)  Wt 167 lb (75.751 kg)  BMI 27.79 kg/m2  LMP 07/11/2011 Pelvic:Pelvic exam: VULVA: normal appearing vulva with no masses, tenderness or lesions, VAGINA: normal appearing vagina with normal color and discharge, no lesions, CERVIX: normal appearing cervix without discharge or lesions, UTERUS: uterus is normal size, shape, consistency and nontender, ADNEXA: normal adnexa in size, nontender and no masses Labs: hCG on 6/11/ 2013. <2  Impression: Spontaneous abortion History of 3 prior cesarean sections Desire for conception Amenorrhea  Recommendation: We had a prolonged discussion about these complex clinical issues and went over the various important aspects to consider. All questions were answered. I reviewed the high-risk nature of multiple cesarean sections from the standpoint of abnormal placentation with the need for care for a possible placenta previa and possible placenta accreta. I reviewed that this sometimes requires hysterectomy at the time of cesarean section. I also reviewed the high-risk nature of pregnancy at age 40 with respect to aneuploidy. I reviewed the high-risk nature of multiple gestations with respect to increased risk of pregnancy-induced hypertension, preterm labor, and gestational diabetes. The patient wishes to try to  conceive as soon as possible. I recommended that she take Provera if she has not had menses by October 20, 2011. Continue prenatal vitamins. Tried to delay conception for at least 3 months. Followup October for annual examination.

## 2011-10-16 IMAGING — CR DG CHEST 2V
2 series · 2 of 2 positions shown · non-contrast
Comparison: None

CLINICAL DATA: Chest pain.  Short of breath.

CHEST - 2 VIEW

[w chest pa]
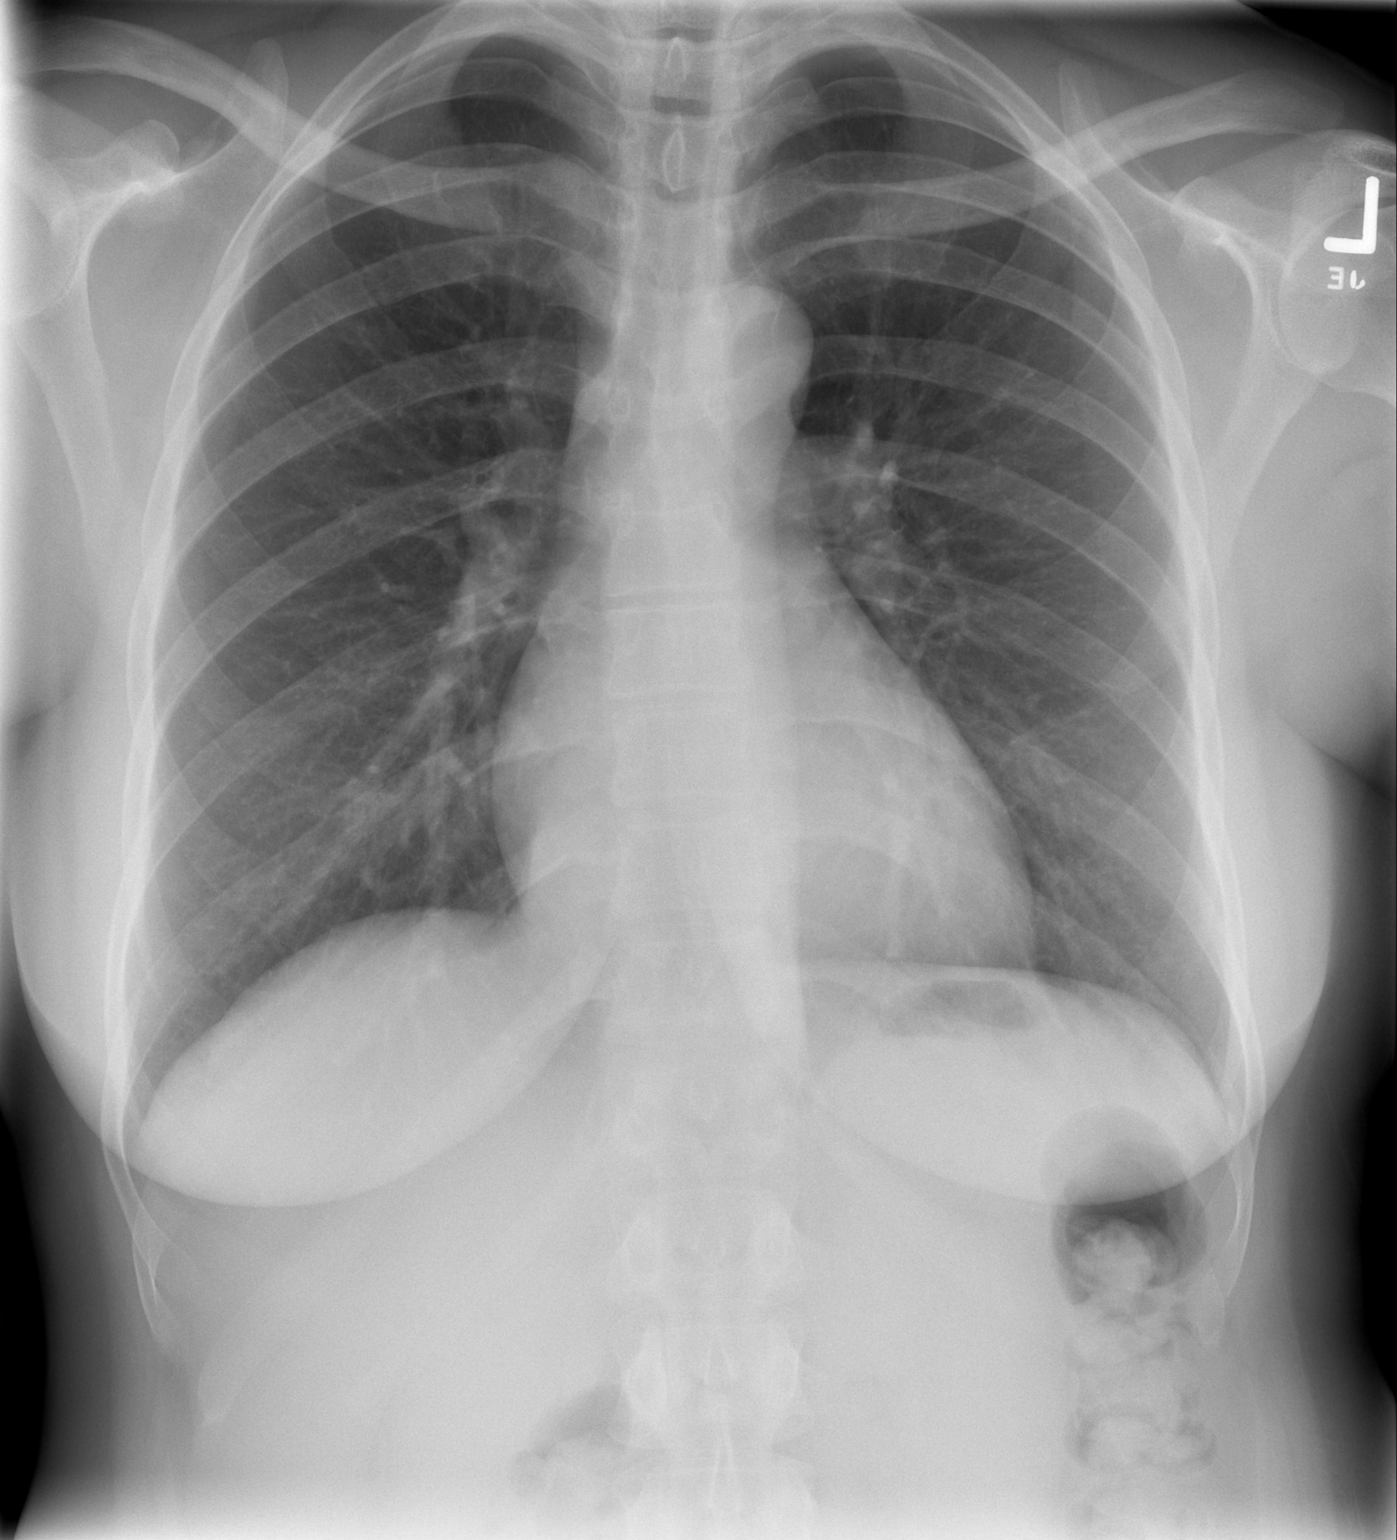

[w chest lat]
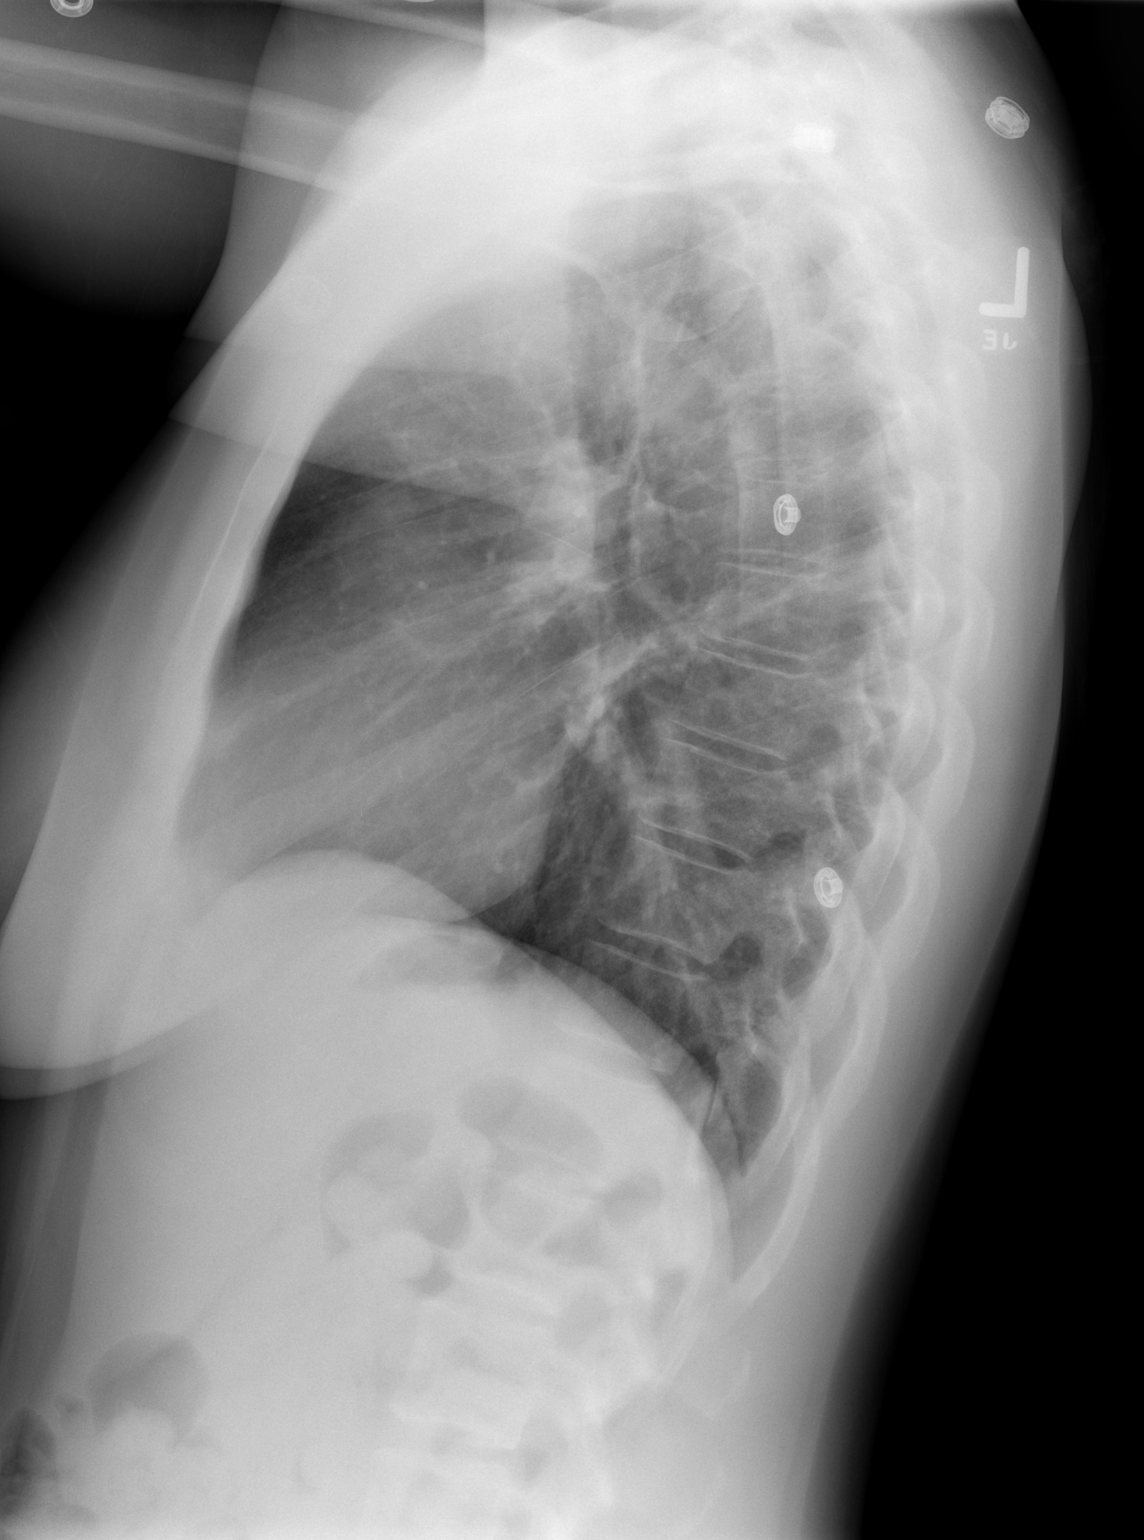

[2 of 2 positions shown; findings below may reference images not displayed]

FINDINGS: Heart size is normal.  Mediastinal shadows are normal.
The lungs are clear.  No effusions.  No significant bony finding.
IMPRESSION: Normal chest

## 2011-10-23 ENCOUNTER — Encounter: Payer: 59 | Admitting: Obstetrics and Gynecology

## 2011-12-26 ENCOUNTER — Ambulatory Visit (INDEPENDENT_AMBULATORY_CARE_PROVIDER_SITE_OTHER): Payer: 59 | Admitting: Obstetrics and Gynecology

## 2011-12-26 ENCOUNTER — Other Ambulatory Visit: Payer: Self-pay | Admitting: Obstetrics and Gynecology

## 2011-12-26 ENCOUNTER — Encounter: Payer: Self-pay | Admitting: Obstetrics and Gynecology

## 2011-12-26 VITALS — Ht 65.0 in | Wt 166.0 lb

## 2011-12-26 DIAGNOSIS — Z124 Encounter for screening for malignant neoplasm of cervix: Secondary | ICD-10-CM

## 2011-12-26 DIAGNOSIS — N912 Amenorrhea, unspecified: Secondary | ICD-10-CM

## 2011-12-26 DIAGNOSIS — Z1231 Encounter for screening mammogram for malignant neoplasm of breast: Secondary | ICD-10-CM

## 2011-12-26 NOTE — Progress Notes (Signed)
AEX  Last Pap: 01/22/2011 WNL: Yes Regular Periods:yes Contraception: None  Monthly Breast exam:yes Tetanus<44yrs:yes Nl.Bladder Function:yes Daily BMs:yes Healthy Diet:yes Calcium:yes Mammogram:yes  Date of Mammogram: age 41 Exercise:yes Have often Exercise: walking daily Seatbelt: yes Abuse at home: no Stressful work:yes Sigmoid-colonoscopy: None Bone Density: No PCP: Dr. Yetta Barre Change in PMH: None Change in [facility]: None  Subjective:    Lori Watts is a 41 y.o. female, [phone], who presents for an annual exam.     History   Social History  . Marital Status: Married    Spouse Name: N/A    Number of Children: N/A  . Years of Education: N/A   Social History Main Topics  . Smoking status: Never Smoker   . Smokeless tobacco: Never Used  . Alcohol Use: No  . Drug Use: No  . Sexually Active: Yes    Birth Control/ Protection: Coitus interruptus     Intercourse last night   Other Topics Concern  . None   Social History Narrative  . None    Menstrual cycle:   LMP: Patient's last menstrual period was 07/11/2011.           Cycle: had spontaneous cycle 8/13 and 9/13.  See prior visit for hx of amenorrhea  The following portions of the patient's history were reviewed and updated as appropriate: allergies, current medications, past family history, past medical history, past social history, past surgical history and problem list.  Review of Systems Pertinent items are noted in HPI. Breast:Negative for breast lump,nipple discharge or nipple retraction Gastrointestinal: Negative for abdominal pain, change in bowel habits or rectal bleeding Urinary:negative   Objective:    LMP 07/11/2011    Weight:  Wt Readings from Last 1 Encounters:  10/04/11 167 lb (75.751 kg)          BMI: There is no height or weight on file to calculate BMI.  General Appearance: Alert, appropriate appearance for age. No acute distress HEENT: Grossly normal Neck / Thyroid: Supple, no  masses, nodes or enlargement Lungs: clear to auscultation bilaterally Back: No CVA tenderness Breast Exam: No masses or nodes.No dimpling, nipple retraction or discharge. Cardiovascular: Regular rate and rhythm. S1, S2, no murmur Gastrointestinal: Soft, non-tender, no masses or organomegaly Pelvic Exam: External genitalia: normal general appearance Vaginal: normal mucosa without prolapse or lesions Cervix: normal appearance Adnexa: non palpable Uterus: normal single, nontender Rectal: good sphincter tone and no masses Exam limited by anxiety  Lymphatic Exam: Non-palpable nodes in neck, clavicular, axillary, or inguinal regions  Skin: no rash or abnormalities Neurologic: Normal gait and speech, no tremor  Psychiatric: Alert and oriented, appropriate affect.   Wet Prep:not applicable Urinalysis:not applicable UPT: Not done   Assessment:    Normal gyn exam Desires pregnancy, specifically twin pregnancy  Hx 3 prior c/s   Plan:    Mammogram recommended prior to conception pap smear annually through 2016 return annually or prn STD screening: declined Contraception:no method Pt referred to Dr. Etheleen Sia to discuss desired twin pregnancy Discussed with the pt the increased risk of multiples pregnancy      Dierdre Forth MD

## 2011-12-27 LAB — PAP IG W/ RFLX HPV ASCU

## 2011-12-31 ENCOUNTER — Telehealth: Payer: Self-pay

## 2011-12-31 NOTE — Telephone Encounter (Signed)
Referral faxed to Dr. Lyndal Rainbow office [phone].

## 2011-12-31 NOTE — Telephone Encounter (Signed)
Message copied by Raylene Everts on Mon Dec 31, 2011  8:45 AM ------      Message from: Dierdre Forth P      Created: Wed Dec 26, 2011  4:10 PM       Referral to Dr. Etheleen Sia for fertility and AMA.  Wants twin gestation if possible

## 2012-01-03 ENCOUNTER — Telehealth: Payer: Self-pay

## 2012-01-03 NOTE — Telephone Encounter (Signed)
Pt has appt on 01/30/12 @10 :00 with Dr. Laural Benes with [facility]ductive [facility].

## 2012-01-11 ENCOUNTER — Ambulatory Visit
Admission: RE | Admit: 2012-01-11 | Discharge: 2012-01-11 | Disposition: A | Discharge status: Home / Self Care | Payer: 59 | Source: Ambulatory Visit | Attending: Obstetrics and Gynecology | Admitting: Obstetrics and Gynecology

## 2012-01-11 DIAGNOSIS — Z1231 Encounter for screening mammogram for malignant neoplasm of breast: Secondary | ICD-10-CM

## 2012-01-16 ENCOUNTER — Other Ambulatory Visit: Payer: Self-pay | Admitting: Obstetrics and Gynecology

## 2012-01-16 DIAGNOSIS — R928 Other abnormal and inconclusive findings on diagnostic imaging of breast: Secondary | ICD-10-CM

## 2012-01-18 ENCOUNTER — Ambulatory Visit
Admission: RE | Admit: 2012-01-18 | Discharge: 2012-01-18 | Disposition: A | Discharge status: Home / Self Care | Payer: 59 | Source: Ambulatory Visit | Attending: Obstetrics and Gynecology | Admitting: Obstetrics and Gynecology

## 2012-01-18 DIAGNOSIS — R928 Other abnormal and inconclusive findings on diagnostic imaging of breast: Secondary | ICD-10-CM

## 2012-02-11 ENCOUNTER — Encounter: Payer: Self-pay | Admitting: Obstetrics and Gynecology

## 2013-02-27 ENCOUNTER — Inpatient Hospital Stay (HOSPITAL_COMMUNITY)
Admission: AD | Admit: 2013-02-27 | Discharge: 2013-02-27 | Disposition: A | Discharge status: Home / Self Care | Payer: 59 | Source: Ambulatory Visit | Attending: Obstetrics and Gynecology | Admitting: Obstetrics and Gynecology

## 2013-02-27 ENCOUNTER — Encounter (HOSPITAL_COMMUNITY): Payer: Self-pay | Admitting: *Deleted

## 2013-02-27 DIAGNOSIS — N949 Unspecified condition associated with female genital organs and menstrual cycle: Secondary | ICD-10-CM | POA: Insufficient documentation

## 2013-02-27 DIAGNOSIS — O368190 Decreased fetal movements, unspecified trimester, not applicable or unspecified: Secondary | ICD-10-CM | POA: Insufficient documentation

## 2013-02-27 DIAGNOSIS — R51 Headache: Secondary | ICD-10-CM | POA: Insufficient documentation

## 2013-02-27 DIAGNOSIS — O99891 Other specified diseases and conditions complicating pregnancy: Secondary | ICD-10-CM | POA: Insufficient documentation

## 2013-02-27 LAB — URINALYSIS, ROUTINE W REFLEX MICROSCOPIC
Bilirubin Urine: NEGATIVE
Nitrite: NEGATIVE
Protein, ur: NEGATIVE mg/dL
Specific Gravity, Urine: 1.015 (ref 1.005–1.030)
Urobilinogen, UA: 0.2 mg/dL (ref 0.0–1.0)

## 2013-02-27 MED ORDER — BUTALBITAL-APAP-CAFFEINE 50-325-40 MG PO TABS
2.0000 | ORAL_TABLET | Freq: Once | ORAL | Status: AC
  Administered 2013-02-27: 2 via ORAL
  Filled 2013-02-27: qty 2
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
## 2013-02-27 NOTE — MAU Note (Signed)
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

## 2013-02-27 NOTE — MAU Note (Signed)
Patient states she has a history of migraines and pelvic pain. States a decrease in fetal movement today.

## 2013-03-10 NOTE — Pre-Procedure Instructions (Signed)
Phone call to pt for PAT appt. 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 Attempted multiple times to explain benefit of PAT with C/S but she said "I guess I will have to call my doctor to find another hospital". Arrangement made for pt to come 7:30 on Jan 7/8 for blood work only then telephone interview.

## 2013-03-13 ENCOUNTER — Other Ambulatory Visit: Payer: Self-pay | Admitting: Obstetrics and Gynecology

## 2013-03-16 ENCOUNTER — Encounter (HOSPITAL_COMMUNITY): Payer: Self-pay | Admitting: Pharmacist

## 2013-03-16 ENCOUNTER — Encounter (HOSPITAL_COMMUNITY): Payer: Self-pay

## 2013-03-16 NOTE — Patient Instructions (Addendum)
Your procedure is scheduled on: 04/03/2013  Enter through the Main Entrance of [facility] at: 0800am  Pick up the phone at the desk and dial [phone].  Call this number if you have problems the morning of surgery: [phone].  Remember: Do NOT eat food: AFTER MIDNIGHT THURSDAY Do NOT drink clear liquids after: AFTER MIDNIGHT THURSDAY Take these medicines the morning of surgery with a SIP OF WATER: NONE  Do NOT wear jewelry (body piercing), make-up, or nail polish. Do NOT wear lotions, powders, or perfumes.  You may wear deoderant. Do NOT shave for 48 hours prior to surgery. Do NOT bring valuables to the hospital. Contacts, dentures, or bridgework may not be worn into surgery. Leave suitcase in car.  After surgery it may be brought to your room.  For patients admitted to the hospital, checkout time is 11:00 AM the day of discharge.

## 2013-04-01 ENCOUNTER — Encounter (HOSPITAL_COMMUNITY)
Admission: RE | Admit: 2013-04-01 | Discharge: 2013-04-01 | Disposition: A | Discharge status: Home / Self Care | Payer: 59 | Source: Ambulatory Visit | Attending: Obstetrics and Gynecology | Admitting: Obstetrics and Gynecology

## 2013-04-01 ENCOUNTER — Encounter (INDEPENDENT_AMBULATORY_CARE_PROVIDER_SITE_OTHER): Payer: Self-pay

## 2013-04-01 ENCOUNTER — Encounter (HOSPITAL_COMMUNITY): Payer: Self-pay

## 2013-04-01 DIAGNOSIS — Z01812 Encounter for preprocedural laboratory examination: Secondary | ICD-10-CM | POA: Insufficient documentation

## 2013-04-01 DIAGNOSIS — Z01818 Encounter for other preprocedural examination: Secondary | ICD-10-CM

## 2013-04-01 LAB — CBC
HCT: 30.3 % — ABNORMAL LOW (ref 36.0–46.0)
HEMOGLOBIN: 10 g/dL — AB (ref 12.0–15.0)
MCH: 25.9 pg — ABNORMAL LOW (ref 26.0–34.0)
MCHC: 33 g/dL (ref 30.0–36.0)
MCV: 78.5 fL (ref 78.0–100.0)
Platelets: 234 10*3/uL (ref 150–400)
RBC: 3.86 MIL/uL — ABNORMAL LOW (ref 3.87–5.11)
RDW: 16 % — ABNORMAL HIGH (ref 11.5–15.5)
WBC: 11.1 10*3/uL — AB (ref 4.0–10.5)

## 2013-04-01 LAB — ABO/RH: ABO/RH(D): A POS

## 2013-04-01 LAB — RPR: RPR Ser Ql: NONREACTIVE

## 2013-04-03 ENCOUNTER — Inpatient Hospital Stay (HOSPITAL_COMMUNITY)
Admission: RE | Admit: 2013-04-03 | Discharge: 2013-04-06 | DRG: 766 | Disposition: A | Discharge status: Home / Self Care | Payer: 59 | Source: Ambulatory Visit | Attending: Obstetrics and Gynecology | Admitting: Obstetrics and Gynecology

## 2013-04-03 ENCOUNTER — Inpatient Hospital Stay (HOSPITAL_COMMUNITY): Payer: 59 | Admitting: Anesthesiology

## 2013-04-03 ENCOUNTER — Encounter (HOSPITAL_COMMUNITY)
Admission: RE | Disposition: A | Discharge status: Home / Self Care | Payer: Self-pay | Source: Ambulatory Visit | Attending: Obstetrics and Gynecology

## 2013-04-03 ENCOUNTER — Encounter (HOSPITAL_COMMUNITY): Payer: Self-pay | Admitting: *Deleted

## 2013-04-03 ENCOUNTER — Encounter (HOSPITAL_COMMUNITY): Payer: 59 | Admitting: Anesthesiology

## 2013-04-03 DIAGNOSIS — O09529 Supervision of elderly multigravida, unspecified trimester: Secondary | ICD-10-CM | POA: Diagnosis present

## 2013-04-03 DIAGNOSIS — O34219 Maternal care for unspecified type scar from previous cesarean delivery: Principal | ICD-10-CM | POA: Diagnosis present

## 2013-04-03 DIAGNOSIS — O9903 Anemia complicating the puerperium: Secondary | ICD-10-CM | POA: Diagnosis not present

## 2013-04-03 DIAGNOSIS — D649 Anemia, unspecified: Secondary | ICD-10-CM | POA: Diagnosis present

## 2013-04-03 HISTORY — DX: Unspecified convulsions: R56.9

## 2013-04-03 HISTORY — DX: Cardiac murmur, unspecified: R01.1

## 2013-04-03 LAB — PREPARE RBC (CROSSMATCH)

## 2013-04-03 SURGERY — Surgical Case
Anesthesia: Spinal | Site: Abdomen

## 2013-04-03 MED ORDER — IBUPROFEN 600 MG PO TABS: 600.0000 mg | ORAL_TABLET | Freq: Four times a day (QID) | ORAL | Status: DC | PRN

## 2013-04-03 MED ORDER — METOCLOPRAMIDE HCL 5 MG/ML IJ SOLN: 10.0000 mg | Freq: Three times a day (TID) | INTRAMUSCULAR | Status: DC | PRN

## 2013-04-03 MED ORDER — LACTATED RINGERS IV SOLN
INTRAVENOUS | Status: DC
  Administered 2013-04-03 (×3): via INTRAVENOUS

## 2013-04-03 MED ORDER — KETOROLAC TROMETHAMINE 30 MG/ML IJ SOLN: 15.0000 mg | Freq: Once | INTRAMUSCULAR | Status: DC | PRN

## 2013-04-03 MED ORDER — PROMETHAZINE HCL 25 MG/ML IJ SOLN: 6.2500 mg | INTRAMUSCULAR | Status: DC | PRN

## 2013-04-03 MED ORDER — DIPHENHYDRAMINE HCL 25 MG PO CAPS
25.0000 mg | ORAL_CAPSULE | ORAL | Status: DC | PRN
  Administered 2013-04-03 – 2013-04-04 (×3): 25 mg via ORAL
  Filled 2013-04-03 (×4): qty 1

## 2013-04-03 MED ORDER — DIPHENHYDRAMINE HCL 50 MG/ML IJ SOLN: 12.5000 mg | INTRAMUSCULAR | Status: DC | PRN

## 2013-04-03 MED ORDER — FENTANYL CITRATE 0.05 MG/ML IJ SOLN
INTRAMUSCULAR | Status: AC
  Administered 2013-04-03: 50 ug via INTRAVENOUS
  Filled 2013-04-03: qty 2

## 2013-04-03 MED ORDER — BUPIVACAINE HCL (PF) 0.25 % IJ SOLN
INTRAMUSCULAR | Status: AC
  Filled 2013-04-03: qty 30

## 2013-04-03 MED ORDER — LANOLIN HYDROUS EX OINT: 1.0000 "application " | TOPICAL_OINTMENT | CUTANEOUS | Status: DC | PRN

## 2013-04-03 MED ORDER — SODIUM CHLORIDE 0.9 % IJ SOLN: 3.0000 mL | INTRAMUSCULAR | Status: DC | PRN

## 2013-04-03 MED ORDER — ZOLPIDEM TARTRATE 5 MG PO TABS: 5.0000 mg | ORAL_TABLET | Freq: Every evening | ORAL | Status: DC | PRN

## 2013-04-03 MED ORDER — FENTANYL CITRATE 0.05 MG/ML IJ SOLN
INTRAMUSCULAR | Status: DC | PRN
  Administered 2013-04-03: 50 ug via INTRAVENOUS
  Administered 2013-04-03: 20 ug via INTRATHECAL
  Administered 2013-04-03: 30 ug via INTRAVENOUS

## 2013-04-03 MED ORDER — FENTANYL CITRATE 0.05 MG/ML IJ SOLN
25.0000 ug | INTRAMUSCULAR | Status: DC | PRN
  Administered 2013-04-03: 50 ug via INTRAVENOUS

## 2013-04-03 MED ORDER — WITCH HAZEL-GLYCERIN EX PADS: 1.0000 "application " | MEDICATED_PAD | CUTANEOUS | Status: DC | PRN

## 2013-04-03 MED ORDER — CEFAZOLIN SODIUM-DEXTROSE 2-3 GM-% IV SOLR
2.0000 g | INTRAVENOUS | Status: AC
  Administered 2013-04-03: 2 g via INTRAVENOUS

## 2013-04-03 MED ORDER — LACTATED RINGERS IV SOLN
Freq: Once | INTRAVENOUS | Status: AC
  Administered 2013-04-03: 09:00:00 via INTRAVENOUS

## 2013-04-03 MED ORDER — MENTHOL 3 MG MT LOZG
1.0000 | LOZENGE | OROMUCOSAL | Status: DC | PRN
Start: 2013-04-03 — End: 2013-04-06

## 2013-04-03 MED ORDER — NALBUPHINE HCL 10 MG/ML IJ SOLN
5.0000 mg | INTRAMUSCULAR | Status: DC | PRN
  Administered 2013-04-04: 5 mg via INTRAVENOUS
  Filled 2013-04-03: qty 1

## 2013-04-03 MED ORDER — OXYTOCIN 10 UNIT/ML IJ SOLN
40.0000 [IU] | INTRAVENOUS | Status: DC | PRN
  Administered 2013-04-03: 40 [IU] via INTRAVENOUS

## 2013-04-03 MED ORDER — LACTATED RINGERS IV SOLN
INTRAVENOUS | Status: DC
  Administered 2013-04-03: 21:00:00 via INTRAVENOUS

## 2013-04-03 MED ORDER — ONDANSETRON HCL 4 MG PO TABS
4.0000 mg | ORAL_TABLET | ORAL | Status: DC | PRN
  Administered 2013-04-06: 4 mg via ORAL
  Filled 2013-04-03: qty 1

## 2013-04-03 MED ORDER — SIMETHICONE 80 MG PO CHEW
80.0000 mg | CHEWABLE_TABLET | Freq: Three times a day (TID) | ORAL | Status: DC
  Administered 2013-04-04 – 2013-04-06 (×8): 80 mg via ORAL
  Filled 2013-04-03 (×8): qty 1

## 2013-04-03 MED ORDER — BUPIVACAINE IN DEXTROSE 0.75-8.25 % IT SOLN
INTRATHECAL | Status: DC | PRN
  Administered 2013-04-03: 1.5 mL via INTRATHECAL

## 2013-04-03 MED ORDER — KETOROLAC TROMETHAMINE 30 MG/ML IJ SOLN
30.0000 mg | Freq: Four times a day (QID) | INTRAMUSCULAR | Status: AC | PRN
  Filled 2013-04-03: qty 1

## 2013-04-03 MED ORDER — HYDROMORPHONE HCL PF 1 MG/ML IJ SOLN
INTRAMUSCULAR | Status: DC | PRN
  Administered 2013-04-03: 1 mg via INTRAVENOUS

## 2013-04-03 MED ORDER — MIDAZOLAM HCL 2 MG/2ML IJ SOLN: 0.5000 mg | Freq: Once | INTRAMUSCULAR | Status: DC | PRN

## 2013-04-03 MED ORDER — ONDANSETRON HCL 4 MG/2ML IJ SOLN
4.0000 mg | Freq: Three times a day (TID) | INTRAMUSCULAR | Status: DC | PRN
  Filled 2013-04-03: qty 2

## 2013-04-03 MED ORDER — MORPHINE SULFATE (PF) 0.5 MG/ML IJ SOLN
INTRAMUSCULAR | Status: DC | PRN
  Administered 2013-04-03: .2 mg via INTRATHECAL

## 2013-04-03 MED ORDER — SIMETHICONE 80 MG PO CHEW
80.0000 mg | CHEWABLE_TABLET | ORAL | Status: DC
Start: 2013-04-04 — End: 2013-04-06
  Administered 2013-04-03 – 2013-04-05 (×3): 80 mg via ORAL
  Filled 2013-04-03 (×3): qty 1

## 2013-04-03 MED ORDER — NALBUPHINE HCL 10 MG/ML IJ SOLN
5.0000 mg | INTRAMUSCULAR | Status: DC | PRN
  Filled 2013-04-03: qty 1

## 2013-04-03 MED ORDER — MEASLES, MUMPS & RUBELLA VAC ~~LOC~~ INJ: 0.5000 mL | INJECTION | Freq: Once | SUBCUTANEOUS | Status: DC

## 2013-04-03 MED ORDER — NALOXONE HCL 0.4 MG/ML IJ SOLN: 0.4000 mg | INTRAMUSCULAR | Status: DC | PRN

## 2013-04-03 MED ORDER — SCOPOLAMINE 1 MG/3DAYS TD PT72
1.0000 | MEDICATED_PATCH | Freq: Once | TRANSDERMAL | Status: DC
  Administered 2013-04-03: 1.5 mg via TRANSDERMAL

## 2013-04-03 MED ORDER — DOCUSATE SODIUM 100 MG PO CAPS
100.0000 mg | ORAL_CAPSULE | Freq: Two times a day (BID) | ORAL | Status: DC
  Administered 2013-04-03 – 2013-04-06 (×6): 100 mg via ORAL
  Filled 2013-04-03 (×6): qty 1

## 2013-04-03 MED ORDER — KETOROLAC TROMETHAMINE 30 MG/ML IJ SOLN
30.0000 mg | Freq: Four times a day (QID) | INTRAMUSCULAR | Status: AC | PRN
  Administered 2013-04-03: 30 mg via INTRAMUSCULAR

## 2013-04-03 MED ORDER — PHENYLEPHRINE 8 MG IN D5W 100 ML (0.08MG/ML) PREMIX OPTIME
INJECTION | INTRAVENOUS | Status: DC | PRN
  Administered 2013-04-03: 60 ug/min via INTRAVENOUS

## 2013-04-03 MED ORDER — DIBUCAINE 1 % RE OINT: 1.0000 "application " | TOPICAL_OINTMENT | RECTAL | Status: DC | PRN

## 2013-04-03 MED ORDER — ACETAMINOPHEN 500 MG PO TABS
1000.0000 mg | ORAL_TABLET | Freq: Four times a day (QID) | ORAL | Status: AC
  Administered 2013-04-03 – 2013-04-04 (×2): 1000 mg via ORAL
  Filled 2013-04-03 (×2): qty 2

## 2013-04-03 MED ORDER — PRENATAL MULTIVITAMIN CH
1.0000 | ORAL_TABLET | Freq: Every day | ORAL | Status: DC
  Administered 2013-04-04 – 2013-04-05 (×2): 1 via ORAL
  Filled 2013-04-03 (×2): qty 1

## 2013-04-03 MED ORDER — IBUPROFEN 600 MG PO TABS
600.0000 mg | ORAL_TABLET | Freq: Four times a day (QID) | ORAL | Status: DC
  Administered 2013-04-04 – 2013-04-06 (×10): 600 mg via ORAL
  Filled 2013-04-03 (×10): qty 1

## 2013-04-03 MED ORDER — METHYLERGONOVINE MALEATE 0.2 MG PO TABS: 0.2000 mg | ORAL_TABLET | ORAL | Status: DC | PRN

## 2013-04-03 MED ORDER — ATROPINE SULFATE 0.4 MG/ML IJ SOLN
INTRAMUSCULAR | Status: DC | PRN
  Administered 2013-04-03: .2 mg via INTRAVENOUS

## 2013-04-03 MED ORDER — DIPHENHYDRAMINE HCL 50 MG/ML IJ SOLN: 25.0000 mg | INTRAMUSCULAR | Status: DC | PRN

## 2013-04-03 MED ORDER — NALOXONE HCL 1 MG/ML IJ SOLN
1.0000 ug/kg/h | INTRAVENOUS | Status: DC | PRN
  Filled 2013-04-03 (×2): qty 2

## 2013-04-03 MED ORDER — ONDANSETRON HCL 4 MG/2ML IJ SOLN
4.0000 mg | INTRAMUSCULAR | Status: DC | PRN
  Administered 2013-04-03: 4 mg via INTRAVENOUS

## 2013-04-03 MED ORDER — BUPIVACAINE HCL (PF) 0.25 % IJ SOLN
INTRAMUSCULAR | Status: DC | PRN
  Administered 2013-04-03: 20 mL

## 2013-04-03 MED ORDER — METHYLERGONOVINE MALEATE 0.2 MG/ML IJ SOLN: 0.2000 mg | INTRAMUSCULAR | Status: DC | PRN

## 2013-04-03 MED ORDER — SCOPOLAMINE 1 MG/3DAYS TD PT72: 1.0000 | MEDICATED_PATCH | Freq: Once | TRANSDERMAL | Status: DC

## 2013-04-03 MED ORDER — IBUPROFEN 600 MG PO TABS: 600.0000 mg | ORAL_TABLET | Freq: Four times a day (QID) | ORAL | Status: DC

## 2013-04-03 MED ORDER — SIMETHICONE 80 MG PO CHEW
80.0000 mg | CHEWABLE_TABLET | ORAL | Status: DC | PRN
Start: 2013-04-03 — End: 2013-04-06

## 2013-04-03 MED ORDER — TRAMADOL HCL 50 MG PO TABS
50.0000 mg | ORAL_TABLET | Freq: Four times a day (QID) | ORAL | Status: DC
  Administered 2013-04-03 – 2013-04-06 (×11): 50 mg via ORAL
  Filled 2013-04-03 (×11): qty 1

## 2013-04-03 MED ORDER — OXYTOCIN 40 UNITS IN LACTATED RINGERS INFUSION - SIMPLE MED: 62.5000 mL/h | INTRAVENOUS | Status: AC

## 2013-04-03 MED ORDER — TETANUS-DIPHTH-ACELL PERTUSSIS 5-2.5-18.5 LF-MCG/0.5 IM SUSP: 0.5000 mL | Freq: Once | INTRAMUSCULAR | Status: DC

## 2013-04-03 MED ORDER — FERROUS SULFATE 325 (65 FE) MG PO TABS
325.0000 mg | ORAL_TABLET | Freq: Two times a day (BID) | ORAL | Status: DC
  Administered 2013-04-04 – 2013-04-06 (×5): 325 mg via ORAL
  Filled 2013-04-03 (×5): qty 1

## 2013-04-03 SURGICAL SUPPLY — 39 items
BARRIER ADHS 3X4 INTERCEED (GAUZE/BANDAGES/DRESSINGS) ×4 IMPLANT
BENZOIN TINCTURE PRP APPL 2/3 (GAUZE/BANDAGES/DRESSINGS) ×2 IMPLANT
BOOTIES KNEE HIGH SLOAN (MISCELLANEOUS) ×4 IMPLANT
CLAMP CORD UMBIL (MISCELLANEOUS) ×2 IMPLANT
CLOTH BEACON ORANGE TIMEOUT ST (SAFETY) ×2 IMPLANT
DRAIN JACKSON PRT FLT 10 (DRAIN) IMPLANT
DRAPE LG THREE QUARTER DISP (DRAPES) ×2 IMPLANT
DRSG OPSITE POSTOP 4X10 (GAUZE/BANDAGES/DRESSINGS) ×2 IMPLANT
DURAPREP 26ML APPLICATOR (WOUND CARE) ×2 IMPLANT
ELECT REM PT RETURN 9FT ADLT (ELECTROSURGICAL) ×2
ELECTRODE REM PT RTRN 9FT ADLT (ELECTROSURGICAL) ×1 IMPLANT
EVACUATOR SILICONE 100CC (DRAIN) IMPLANT
EXTRACTOR VACUUM M CUP 4 TUBE (SUCTIONS) IMPLANT
GLOVE BIOGEL PI IND STRL 7.0 (GLOVE) ×1 IMPLANT
GLOVE BIOGEL PI INDICATOR 7.0 (GLOVE) ×1
GLOVE ECLIPSE 6.5 STRL STRAW (GLOVE) ×2 IMPLANT
GOWN PREVENTION PLUS XLARGE (GOWN DISPOSABLE) ×2 IMPLANT
GOWN STRL REIN XL XLG (GOWN DISPOSABLE) ×2 IMPLANT
HEMOSTAT SURGICEL 4X8 (HEMOSTASIS) ×2 IMPLANT
KIT ABG SYR 3ML LUER SLIP (SYRINGE) IMPLANT
NEEDLE HYPO 22GX1.5 SAFETY (NEEDLE) ×2 IMPLANT
NEEDLE HYPO 25X5/8 SAFETYGLIDE (NEEDLE) ×2 IMPLANT
NS IRRIG 1000ML POUR BTL (IV SOLUTION) ×4 IMPLANT
PACK C SECTION WH (CUSTOM PROCEDURE TRAY) ×2 IMPLANT
PAD OB MATERNITY 4.3X12.25 (PERSONAL CARE ITEMS) ×2 IMPLANT
PENCIL BUTTON HOLSTER BLD 10FT (ELECTRODE) ×2 IMPLANT
RTRCTR C-SECT PINK 25CM LRG (MISCELLANEOUS) ×2 IMPLANT
SPONGE LAP 18X18 X RAY DECT (DISPOSABLE) ×6 IMPLANT
STRIP CLOSURE SKIN 1/2X4 (GAUZE/BANDAGES/DRESSINGS) ×2 IMPLANT
SUT CHROMIC GUT AB #0 18 (SUTURE) IMPLANT
SUT MNCRL AB 3-0 PS2 27 (SUTURE) ×2 IMPLANT
SUT SILK 2 0 FSL 18 (SUTURE) ×6 IMPLANT
SUT VIC AB 0 CTX 36 (SUTURE) ×6
SUT VIC AB 0 CTX36XBRD ANBCTRL (SUTURE) ×6 IMPLANT
SUT VIC AB 1 CT1 36 (SUTURE) ×4 IMPLANT
SYR 20CC LL (SYRINGE) ×2 IMPLANT
TOWEL OR 17X24 6PK STRL BLUE (TOWEL DISPOSABLE) ×2 IMPLANT
TRAY FOLEY CATH 14FR (SET/KITS/TRAYS/PACK) ×2 IMPLANT
WATER STERILE IRR 1000ML POUR (IV SOLUTION) ×2 IMPLANT
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
## 2013-04-03 NOTE — Transfer of Care (Signed)
Immediate Anesthesia Transfer of Care Note  Patient: Lori Watts  Procedure(s) Performed: Procedure(s): REPEAT CESAREAN SECTION (N/A)  Patient Location: PACU  Anesthesia Type:Spinal  Level of Consciousness: awake, alert  and oriented  Airway & Oxygen Therapy: Patient Spontanous Breathing  Post-op Assessment: Report given to PACU RN and Post -op Vital signs reviewed and stable  Post vital signs: Reviewed and stable  Complications: No apparent anesthesia complications

## 2013-04-03 NOTE — Anesthesia Postprocedure Evaluation (Signed)
  Anesthesia Post-op Note  Patient: Lori Watts  Procedure(s) Performed: Procedure(s): REPEAT CESAREAN SECTION (N/A)  Patient Location: PACU and Mother/Baby  Anesthesia Type:Spinal  Level of Consciousness: awake, alert  and oriented  Airway and Oxygen Therapy: Patient Spontanous Breathing  Post-op Pain: mild  Post-op Assessment: Post-op Vital signs reviewed and Patient's Cardiovascular Status Stable  Post-op Vital Signs: Reviewed and stable  Complications: No apparent anesthesia complications

## 2013-04-03 NOTE — Anesthesia Preprocedure Evaluation (Signed)
Anesthesia Evaluation  Patient identified by MRN, date of birth, ID band Patient awake    Reviewed: Allergy & Precautions, H&P , NPO status , Patient's Chart, lab work & pertinent test results  History of Anesthesia Complications (+) POST - OP SPINAL HEADACHE and history of anesthetic complications  Airway Mallampati: II      Dental   Pulmonary  breath sounds clear to auscultation        Cardiovascular Exercise Tolerance: Good Rhythm:regular Rate:Normal     Neuro/Psych    GI/Hepatic   Endo/Other  diabetes  Renal/GU      Musculoskeletal   Abdominal   Peds  Hematology  (+) anemia ,   Anesthesia Other Findings   Reproductive/Obstetrics (+) Pregnancy                           Anesthesia Physical Anesthesia Plan  ASA: III  Anesthesia Plan: Spinal   Post-op Pain Management:    Induction:   Airway Management Planned:   Additional Equipment:   Intra-op Plan:   Post-operative Plan:   Informed Consent: I have reviewed the patients History and Physical, chart, labs and discussed the procedure including the risks, benefits and alternatives for the proposed anesthesia with the patient or authorized representative who has indicated his/her understanding and acceptance.     Plan Discussed with: Anesthesiologist, CRNA and Surgeon  Anesthesia Plan Comments:         Anesthesia Quick Evaluation

## 2013-04-03 NOTE — Anesthesia Procedure Notes (Signed)
Spinal  Patient location during procedure: OR Start time: 04/03/2013 9:44 AM End time: 04/03/2013 9:46 AM Staffing Anesthesiologist: Leilani AbleHATCHETT, Lord Lancour Performed by: anesthesiologist  Preanesthetic Checklist Completed: patient identified, surgical consent, pre-op evaluation, timeout performed, IV checked, risks and benefits discussed and monitors and equipment checked Spinal Block Patient position: sitting Prep: DuraPrep Patient monitoring: heart rate, cardiac monitor, continuous pulse ox and blood pressure Approach: midline Location: L3-4 Injection technique: single-shot Needle Needle type: Sprotte  Needle gauge: 24 G Needle length: 9 cm Needle insertion depth: 5 cm Assessment Sensory level: T4

## 2013-04-03 NOTE — Lactation Note (Signed)
This note was copied from the chart of [name]. Lactation Consultation Note Initial consultation in PACU for low CBG and no latch. Mom holding baby STS in PACU. Offered to assist, mom accepts. With mom's permission, hand expressed large drops colostrum and attempted to get baby latched on. Baby did not latch. Baby jittery.  Offered to hand express and spoon or cup feed baby; mom states she rather use a bottle.  Enc mom that she can use a bottle now to help with baby's CBG, then staff will assist her to breast feed when baby more interested in latching. Enc mom to call for assistance if needed.    Patient Name: [name] WUJWJ'XToday's Date: 04/03/2013 Reason for consult: Initial assessment   Maternal Data Formula Feeding for Exclusion: Yes Reason for exclusion: Mother's choice to formula and breast feed on admission Infant to breast within first hour of birth:  (attempt) Has patient been taught Hand Expression?: Yes Does the patient have breastfeeding experience prior to this delivery?: Yes  Feeding Feeding Type: Bottle Fed - Formula Nipple Type: Slow - flow  LATCH Score/Interventions Latch: Too sleepy or reluctant, no latch achieved, no sucking elicited.                    Lactation Tools Discussed/Used     Consult Status Consult Status: Follow-up    Octavio MannsSanders, Jovonne Wilton [facility] 04/03/2013, 3:58 PM

## 2013-04-03 NOTE — Op Note (Signed)
Preoperative diagnosis: Intrauterine pregnancy at 39 weeks with 3 previous cesarean sections  Post operative diagnosis: Same with severe dense pelvic adhesions  Anesthesia: Spinal  Anesthesiologist: Dr. Arby BarretteHatchett  Procedure: Repeat low transverse cesarean section  Surgeon: Dr. Dois DavenportSandra Myishia Kasik  Assistant: Dr Dierdre ForthVanessa Haygood  Estimated blood loss: 800 cc  Procedure:  After being informed of the planned procedure and possible complications including bleeding, infection, injury to other organs, informed consent is obtained. The patient is taken to OR #7 and given spinal anesthesia without complication. She is placed in the dorsal decubitus position with the pelvis tilted to the left. She is then prepped and draped in a sterile fashion. A Foley catheter is inserted in her bladder.  After assessing adequate level of anesthesia, we infiltrate the suprapubic area with 20 cc of Marcaine 0.25 and perform a Pfannenstiel incision which is brought down sharply to the fascia. The fascia is entered in a low transverse fashion. Linea alba is dissected with great difficulty do to dense adhesions between the abdominal wall and the anterior aspect of the uterus from the bladder the flexion all the way to the fundus. We are able to identify peritoneum at the upper aspect of our dissection and opened it bluntly. This allows us to then sharply dissect the linea alba all the way down to the lower uterine segment. We still are very limited and are unable to dissect the anterior cul-de-sac. During this dissection a small defect was created in the mid body of the uterus measuring approximately 2 cm. We are unable to identify the vesicouterine junction but will be able to enter the uterus just below the uterine defect. An Alexis retractor is easily positioned.   The myometrium is then entered in a low transverse fashion ; first with knife and then extended bluntly. Amniotic fluid is light meconium. We assist the birth of a  female  infant in vertex presentation. Mouth and nose are suctioned. The baby is delivered. The cord is clamped and sectioned. The baby is given to the neonatologist present in the room.  10 cc of blood is drawn from the umbilical vein.The placenta is allowed to deliver spontaneously. It is complete and the cord has 3 vessels. Uterine revision is negative.  We proceed with closure of the myometrium in 2 layers: First with a running locked suture of 0 Vicryl, then with a Lembert suture of 0 Vicryl imbricating the first one. In the process we closed the previously mentioned uterine defect with 3 figure-of-eight stitches of 0 Vicryl. Hemostasis is completed with multiple figure-of-eight stitches mainly in the left angle area incorporating the upper portion of the uterus. Bleeding on the left round ligament is also controlled with 2 figure-of-eight stitches of 0 Vicryl. Hemostasis is then deemed adequate.  Both paracolic gutters are cleaned. Both tubes and ovaries are assessed and normal. The pelvis is profusely irrigated with warm saline to confirm a satisfactory hemostasis. 1 sheet of Surgicel covers the uterine opening. We then positioned 2 sheets of Interceed to completely cover the uterus.  Retractors and sponges are removed. Under fascia hemostasis is completed with cauterization. The fascia is then closed with 2 running sutures of 0 Vicryl meeting midline. The wound is irrigated with warm saline and hemostasis is completed with cauterization. The skin is closed with a subcuticular suture of 3-0 Monocryl and Steri-Strips.  Instrument and sponge count is complete x2. Estimated blood loss is 800 cc.  The procedure is well tolerated by the patient who is taken  to recovery room in a well and stable condition.  female baby named Courtland was born at 54 and received an Apgar of 9  at 1 minute and 9 at 5 minutes.    Specimen: Placenta sent to L & D   Yomayra Tate A MD 1/9/201511:19 AM

## 2013-04-03 NOTE — Anesthesia Postprocedure Evaluation (Signed)
  Anesthesia Post Note  Patient: Lori Watts  Procedure(s) Performed: Procedure(s) (LRB): REPEAT CESAREAN SECTION (N/A)  Anesthesia type: sab  Patient location: PACU  Post pain: Pain level controlled  Post assessment: Post-op Vital signs reviewed  Last Vitals:  Filed Vitals:   04/03/13 1125  BP: 103/52  Pulse: 57  Temp: 36.4 C  Resp: 16    Post vital signs: Reviewed  Level of consciousness: sedated  Complications: No apparent anesthesia complications

## 2013-04-03 NOTE — Lactation Note (Signed)
This note was copied from the chart of Lori Watts. Lactation Consultation Note  Patient Name: Lori Watts [id number]'s Date: 04/03/2013 Reason for consult: Follow-up assessment of this multipara and her newborn, now 5611 hours of age and having received several formula feedings when unable to latch to breast.  Mom has easily expressible colostrum and everted nipples but baby too sleepy to latch at this time. Mom has previous breastfeeding and pumping experience but is sleepy after C/S and baby sleepy after bath.  Brief rooting elicited with gentle stimulation but he is not able to open wide enough for deep areolar grasp and sustained sucking.   LC assisted with cradle, cross-cradle and football position on (L) breast  without success.  LC encouraged continued STS, hand expressing colostrum and cue feeding and avoid supplement unless medically indicated while baby learning to breastfeed   LC reviewed LEAD guidelines and cautions regarding early supplementation.    Maternal Data    Feeding    LATCH Score/Interventions Latch: Too sleepy or reluctant, no latch achieved, no sucking elicited. Intervention(s): Skin to skin;Teach feeding cues;Waking techniques Intervention(s): Adjust position;Assist with latch;Breast compression  Audible Swallowing: None  Type of Nipple: Everted at rest and after stimulation  Comfort (Breast/Nipple): Soft / non-tender     Hold (Positioning): Assistance needed to correctly position infant at breast and maintain latch. Intervention(s): Breastfeeding basics reviewed;Support Pillows;Position options;Skin to skin (tried various positions but no latch achieved)  LATCH Score: 5   (several attempts with LC and a few drops of colostrum expressed)  Lactation Tools Discussed/Used   STS, hand expression, cue feedings  Consult Status Consult Status: Follow-up Date: 04/04/13 Follow-up type: In-patient    Warrick ParisianBryant, [name] [facility] 04/03/2013, 9:53  PM

## 2013-04-03 NOTE — Consult Note (Signed)
Neonatology Note:   Attendance at C-section:    I was asked by Dr. Estanislado Pandyivard to attend this repeat C/S at term. 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 ROM at delivery, fluid clear. Infant vigorous with good spontaneous cry and tone. Needed only minimal bulb suctioning. Ap 9/9. Lungs clear to ausc in DR. To CN to care of Pediatrician.   Doretha Souhristie C. Zandon Talton, MD

## 2013-04-03 NOTE — Interval H&P Note (Signed)
History and Physical Interval Note:  04/03/2013 9:23 AM  Lori Watts  has presented today for surgery, with the diagnosis of Previous C/S  The various methods of treatment have been discussed with the patient and family. After consideration of risks, benefits and other options for treatment, the patient has consented to  Procedure(s): REPEAT CESAREAN SECTION (N/A) as a surgical intervention .  The patient's history has been reviewed, patient examined, no change in status, stable for surgery.  I have reviewed the patient's chart and labs.  Questions were answered to the patient's satisfaction.     Milam Allbaugh A

## 2013-04-04 LAB — CBC
HCT: 25.1 % — ABNORMAL LOW (ref 36.0–46.0)
Hemoglobin: 8.5 g/dL — ABNORMAL LOW (ref 12.0–15.0)
MCH: 26.4 pg (ref 26.0–34.0)
MCHC: 33.9 g/dL (ref 30.0–36.0)
MCV: 78 fL (ref 78.0–100.0)
Platelets: 228 10*3/uL (ref 150–400)
RBC: 3.22 MIL/uL — AB (ref 3.87–5.11)
RDW: 15.7 % — ABNORMAL HIGH (ref 11.5–15.5)
WBC: 13.5 10*3/uL — ABNORMAL HIGH (ref 4.0–10.5)

## 2013-04-04 NOTE — Progress Notes (Signed)
Subjective: Postpartum Day 1: Cesarean Delivery Patient reports incisional pain and tolerating PO.    Objective: Vital signs in last 24 hours: Temp:  [97.9 F (36.6 C)-98.8 F (37.1 C)] 98.3 F (36.8 C) (01/10 1027) Pulse Rate:  [66-82] 82 (01/10 1027) Resp:  [16-20] 17 (01/10 1027) BP: (105-133)/(62-87) 108/62 mmHg (01/10 1027) SpO2:  [97 %-98 %] 98 % (01/10 1027)  Physical Exam:  General: alert and cooperative Lochia: appropriate Uterine Fundus: firm Incision: no significant drainage DVT Evaluation: No evidence of DVT seen on physical exam.   Recent Labs  04/04/13 0553  HGB 8.5*  HCT 25.1*    Assessment/Plan: Status post Cesarean section. Doing well postoperatively.  Continue current care.  Zillah Alexie A 04/04/2013, 5:03 PM

## 2013-04-05 LAB — TYPE AND SCREEN
ABO/RH(D): A POS
ANTIBODY SCREEN: NEGATIVE
UNIT DIVISION: 0
UNIT DIVISION: 0

## 2013-04-05 NOTE — Progress Notes (Signed)
Subjective: Postpartum Day 2: Cesarean Delivery due to repeat Patient up ad lib, reports no syncope or dizziness. Feeding:  Breast Contraceptive plan:  Undecided Reports more soreness in incision today--doesn't like way Percocet makes her feel ("got dizzy holding my last baby and almost dropped her") Rx'd Ultram yesterday--seems to be working fairly well. Planning D/C tomorrow.  Objective: Vital signs in last 24 hours: Temp:  [97.5 F (36.4 C)-98.3 F (36.8 C)] 97.5 F (36.4 C) (01/11 0540) Pulse Rate:  [81-83] 81 (01/11 0540) Resp:  [17-18] 18 (01/11 0540) BP: (108-140)/(62-77) 122/74 mmHg (01/11 0540) SpO2:  [98 %] 98 % (01/10 1027)  Physical Exam:  General: alert Lochia: appropriate Uterine Fundus: firm Incision: Honeycomb dressing CDI DVT Evaluation: No evidence of DVT seen on physical exam. Negative Homan's sign. JP drain:   NA   Recent Labs  04/04/13 0553  HGB 8.5*  HCT 25.1*    Assessment/Plan: Status post Cesarean section day 2 Anemia without hemodynamic instability. Doing well postoperatively.  Continue current care. Plan for discharge tomorrow    Nigel BridgemanLATHAM, Preston Weill 04/05/2013, 8:22 AM

## 2013-04-06 ENCOUNTER — Encounter (HOSPITAL_COMMUNITY): Payer: Self-pay | Admitting: Obstetrics and Gynecology

## 2013-04-06 DIAGNOSIS — D649 Anemia, unspecified: Secondary | ICD-10-CM | POA: Diagnosis present

## 2013-04-06 MED ORDER — FERROUS SULFATE 325 (65 FE) MG PO TABS: 325.0000 mg | ORAL_TABLET | Freq: Every day | ORAL | Status: AC

## 2013-04-06 MED ORDER — TRAMADOL HCL 50 MG PO TABS: 50.0000 mg | ORAL_TABLET | Freq: Four times a day (QID) | ORAL | Status: DC | PRN

## 2013-04-06 MED ORDER — IBUPROFEN 600 MG PO TABS: 600.0000 mg | ORAL_TABLET | Freq: Four times a day (QID) | ORAL | Status: DC | PRN

## 2013-04-06 NOTE — Discharge Summary (Signed)
Cesarean Section Delivery Discharge Summary  Lori Watts  DOB:    13-Oct-1970 MRN:    [id number] CSN:    [id number]  Date of admission:                  04/03/13  Date of discharge:                   04/06/13  Procedures this admission:  Repeat LTCS by Dr. Dois Davenport Rivard.  Date of Delivery: 04/03/13  Newborn Data:  Live born female  Birth Weight: 8 lb 15.4 oz (4065 g) APGAR: 9, 9  Home with mother. Circumcision Plan: Done as inpatient  History of Present Illness:  Ms. Lori Watts is a 43 y.o. female, [id number], who presents at [redacted]w[redacted]d weeks gestation. The patient has been followed at the [facility] and Gynecology division of [facility].    Her pregnancy has been complicated by:  Patient Active Problem List   Diagnosis Date Noted  . Anemia 04/06/2013  . Cesarean delivery delivered 04/03/2013  . Spontaneous abortion 10/04/2011  . Previous cesarean section 10/04/2011  . 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 Amenorrhea 10/04/2011  Hx 3 previous C/S.  Hospital course:  The patient was admitted for scheduled repeat C/S by Dr. Estanislado Pandy and Dr. Pennie Rushing.   She had an uncomplicated surgical course, and her postpartum course was complicated only by anemia, with no hemodynamic instability.  She was discharged to home on postpartum day 3 doing well.  Feeding:  breast and bottle  Contraception:  no method  Discharge hemoglobin:  Hemoglobin  Date Value Range Status  04/04/2013 8.5* 12.0 - 15.0 g/dL Final     HCT  Date Value Range Status  04/04/2013 25.1* 36.0 - 46.0 % Final    Discharge Physical Exam:   General: alert Lochia: appropriate Uterine Fundus: firm Incision: Honeycomb dressing CDI DVT Evaluation: No evidence of DVT seen on physical exam. Negative Homan's sign.  Intrapartum Procedures: cesarean: low cervical, transverse Postpartum Procedures: none Complications-Operative and Postpartum: Anemia without hemodynamic  instability  Discharge Diagnoses: Term Pregnancy-delivered and previous C/S x 3, anemia.  Discharge Information:  Activity:           Per [facility] handout Diet:                routine Medications: Ibuprofen, Iron and Ultram (declined percocet due to degree of sedation in past) Condition:      stable Instructions:  Care After Cesarean Delivery  Refer to this sheet in the next few weeks. These instructions provide you with information on caring for yourself after your procedure. Your caregiver may also give you specific instructions. Your treatment has been planned according to current medical practices, but problems sometimes occur. Call your caregiver if you have any problems or questions after you go home. HOME CARE INSTRUCTIONS  Only take over-the-counter or prescription medicines as directed by your caregiver.  Do not drink alcohol, especially if you are breastfeeding or taking medicine to relieve pain.  Do not chew or smoke tobacco.  Continue to use good perineal care. Good perineal care includes:  Wiping your perineum from front to back.  Keeping your perineum clean.  Check your cut (incision) daily for increased redness, drainage, swelling, or separation of skin.  Clean your incision gently with soap and water every day, and then pat it dry. If your caregiver says it is okay, leave the incision uncovered. Use a bandage (dressing) if the incision is  draining fluid or appears irritated. If the adhesive strips across the incision do not fall off within 7 days, carefully peel them off.  Hug a pillow when coughing or sneezing until your incision is healed. This helps to relieve pain.  Do not use tampons or douche until your caregiver says it is okay.  Shower, wash your hair, and take tub baths as directed by your caregiver.  Wear a well-fitting bra that provides breast support.  Limit wearing support panties or control-top hose.  Drink enough fluids to keep your urine clear  or pale yellow.  Eat high-fiber foods such as whole grain cereals and breads, brown rice, beans, and fresh fruits and vegetables every day. These foods may help prevent or relieve constipation.  Resume activities such as climbing stairs, driving, lifting, exercising, or traveling as directed by your caregiver.  Talk to your caregiver about resuming sexual activities. This is dependent upon your risk of infection, your rate of healing, and your comfort and desire to resume sexual activity.  Try to have someone help you with your household activities and your newborn for at least a few days after you leave the hospital.  Rest as much as possible. Try to rest or take a nap when your newborn is sleeping.  Increase your activities gradually.  Keep all of your scheduled postpartum appointments. It is very important to keep your scheduled follow-up appointments. At these appointments, your caregiver will be checking to make sure that you are healing physically and emotionally. SEEK MEDICAL CARE IF:   You are passing large clots from your vagina. Save any clots to show your caregiver.  You have a foul smelling discharge from your vagina.  You have trouble urinating.  You are urinating frequently.  You have pain when you urinate.  You have a change in your bowel movements.  You have increasing redness, pain, or swelling near your incision.  You have pus draining from your incision.  Your incision is separating.  You have painful, hard, or reddened breasts.  You have a severe headache.  You have blurred vision or see spots.  You feel sad or depressed.  You have thoughts of hurting yourself or your newborn.  You have questions about your care, the care of your newborn, or medicines.  You are dizzy or lightheaded.  You have a rash.  You have pain, redness, or swelling at the site of the removed intravenous access (IV) tube.  You have nausea or vomiting.  You stopped  breastfeeding and have not had a menstrual period within 12 weeks of stopping.  You are not breastfeeding and have not had a menstrual period within 12 weeks of delivery.  You have a fever. SEEK IMMEDIATE MEDICAL CARE IF:  You have persistent pain.  You have chest pain.  You have shortness of breath.  You faint.  You have leg pain.  You have stomach pain.  Your vaginal bleeding saturates 2 or more sanitary pads in 1 hour. MAKE SURE YOU:   Understand these instructions.  Will watch your condition.  Will get help right away if you are not doing well or get worse. Document Released: 12/02/2001 Document Revised: 12/05/2011 Document Reviewed: 11/07/2011 [facility] Patient Information 2014 [place], [place].   Postpartum Depression and Baby Blues  The postpartum period begins right after the birth of a baby. During this time, there is often a great amount of joy and excitement. It is also a time of considerable changes in the life of the  parent(s). Regardless of how many times a mother gives birth, each child brings new challenges and dynamics to the family. It is not unusual to have feelings of excitement accompanied by confusing shifts in moods, emotions, and thoughts. All mothers are at risk of developing postpartum depression or the "baby blues." These mood changes can occur right after giving birth, or they may occur many months after giving birth. The baby blues or postpartum depression can be mild or severe. Additionally, postpartum depression can resolve rather quickly, or it can be a long-term condition. CAUSES Elevated hormones and their rapid decline are thought to be a main cause of postpartum depression and the baby blues. There are a number of hormones that radically change during and after pregnancy. Estrogen and progesterone usually decrease immediately after delivering your baby. The level of thyroid hormone and various cortisol steroids also rapidly drop. Other factors that  play a major role in these changes include major life events and genetics.  RISK FACTORS If you have any of the following risks for the baby blues or postpartum depression, know what symptoms to watch out for during the postpartum period. Risk factors that may increase the likelihood of getting the baby blues or postpartum depression include:  Havinga personal or family history of depression.  Having depression while being pregnant.  Having premenstrual or oral contraceptive-associated mood issues.  Having exceptional life stress.  Having marital conflict.  Lacking a social support network.  Having a baby with special needs.  Having health problems such as diabetes. SYMPTOMS Baby blues symptoms include:  Brief fluctuations in mood, such as going from extreme happiness to sadness.  Decreased concentration.  Difficulty sleeping.  Crying spells, tearfulness.  Irritability.  Anxiety. Postpartum depression symptoms typically begin within the first month after giving birth. These symptoms include:  Difficulty sleeping or excessive sleepiness.  Marked weight loss.  Agitation.  Feelings of worthlessness.  Lack of interest in activity or food. Postpartum psychosis is a very concerning condition and can be dangerous. Fortunately, it is rare. Displaying any of the following symptoms is cause for immediate medical attention. Postpartum psychosis symptoms include:  Hallucinations and delusions.  Bizarre or disorganized behavior.  Confusion or disorientation. DIAGNOSIS  A diagnosis is made by an evaluation of your symptoms. There are no medical or lab tests that lead to a diagnosis, but there are various questionnaires that a caregiver may use to identify those with the baby blues, postpartum depression, or psychosis. Often times, a screening tool called the [place] Postnatal Depression Scale is used to diagnose depression in the postpartum period.  TREATMENT The baby blues  usually goes away on its own in 1 to 2 weeks. Social support is often all that is needed. You should be encouraged to get adequate sleep and rest. Occasionally, you may be given medicines to help you sleep.  Postpartum depression requires treatment as it can last several months or longer if it is not treated. Treatment may include individual or group therapy, medicine, or both to address any social, physiological, and psychological factors that may play a role in the depression. Regular exercise, a healthy diet, rest, and social support may also be strongly recommended.  Postpartum psychosis is more serious and needs treatment right away. Hospitalization is often needed. HOME CARE INSTRUCTIONS  Get as much rest as you can. Nap when the baby sleeps.  Exercise regularly. Some women find yoga and walking to be beneficial.  Eat a balanced and nourishing diet.  Do little things  that you enjoy. Have a cup of tea, take a bubble bath, read your favorite magazine, or listen to your favorite music.  Avoid alcohol.  Ask for help with household chores, cooking, grocery shopping, or running errands as needed. Do not try to do everything.  Talk to people close to you about how you are feeling. Get support from your partner, family members, friends, or other new moms.  Try to stay positive in how you think. Think about the things you are grateful for.  Do not spend a lot of time alone.  Only take medicine as directed by your caregiver.  Keep all your postpartum appointments.  Let your caregiver know if you have any concerns. SEEK MEDICAL CARE IF: You are having a reaction or problems with your medicine. SEEK IMMEDIATE MEDICAL CARE IF:  You have suicidal feelings.  You feel you may harm the baby or someone else. Document Released: 12/15/2003 Document Revised: 06/04/2011 Document Reviewed: 01/16/2011 [facility] Patient Information 2014 [place], [place].  Discharge to: home  Follow-up Information    Follow up with [facility]. Schedule an appointment as soon as possible for a visit in 6 weeks. (Call for any questions or concerns.)    Specialty:  Obstetrics and Gynecology   Contact information:   [address] [place] [address] [phone]       Nigel Bridgeman 04/06/2013

## 2013-04-06 NOTE — Discharge Instructions (Signed)
Postpartum Care After Cesarean Delivery °After you deliver your newborn (postpartum period), the usual stay in the hospital is 24 72 hours. If there were problems with your labor or delivery, or if you have other medical problems, you might be in the hospital longer.  °While you are in the hospital, you will receive help and instructions on how to care for yourself and your newborn during the postpartum period.  °While you are in the hospital: °· It is normal for you to have pain or discomfort from the incision in your abdomen. Be sure to tell your nurses when you are having pain, where the pain is located, and what makes the pain worse. °· If you are breastfeeding, you may feel uncomfortable contractions of your uterus for a couple of weeks. This is normal. The contractions help your uterus get back to normal size. °· It is normal to have some bleeding after delivery. °· For the first 1 3 days after delivery, the flow is red and the amount may be similar to a period. °· It is common for the flow to start and stop. °· In the first few days, you may pass some small clots. Let your nurses know if you begin to pass large clots or your flow increases. °· Do not  flush blood clots down the toilet before having the nurse look at them. °· During the next 3 10 days after delivery, your flow should become more watery and pink or brown-tinged in color. °· Ten to fourteen days after delivery, your flow should be a small amount of yellowish-white discharge. °· The amount of your flow will decrease over the first few weeks after delivery. Your flow may stop in 6 8 weeks. Most women have had their flow stop by 12 weeks after delivery. °· You should change your sanitary pads frequently. °· Wash your hands thoroughly with soap and water for at least 20 seconds after changing pads, using the toilet, or before holding or feeding your newborn. °· Your intravenous (IV) tubing will be removed when you are drinking enough fluids. °· The  urine drainage tube (urinary catheter) that was inserted before delivery may be removed within 6 8 hours after delivery or when feeling returns to your legs. You should feel like you need to empty your bladder within the first 6 8 hours after the catheter has been removed. °· In case you become weak, lightheaded, or faint, call your nurse before you get out of bed for the first time and before you take a shower for the first time. °· Within the first few days after delivery, your breasts may begin to feel tender and full. This is called engorgement. Breast tenderness usually goes away within 48 72 hours after engorgement occurs. You may also notice milk leaking from your breasts. If you are not breastfeeding, do not stimulate your breasts. Breast stimulation can make your breasts produce more milk. °· Spending as much time as possible with your newborn is very important. During this time, you and your newborn can feel close and get to know each other. Having your newborn stay in your room (rooming in) will help to strengthen the bond with your newborn. It will give you time to get to know your newborn and become comfortable caring for your newborn. °· Your hormones change after delivery. Sometimes the hormone changes can temporarily cause you to feel sad or tearful. These feelings should not last more than a few days. If these feelings last longer   than that, you should talk to your caregiver. °· If desired, talk to your caregiver about methods of family planning or contraception. °· Talk to your caregiver about immunizations. Your caregiver may want you to have the following immunizations before leaving the hospital: °· Tetanus, diphtheria, and pertussis (Tdap) or tetanus and diphtheria (Td) immunization. It is very important that you and your family (including grandparents) or others caring for your newborn are up-to-date with the Tdap or Td immunizations. The Tdap or Td immunization can help protect your newborn  from getting ill. °· Rubella immunization. °· Varicella (chickenpox) immunization. °· Influenza immunization. You should receive this annual immunization if you did not receive the immunization during your pregnancy. °Document Released: 12/05/2011 Document Reviewed: 12/05/2011 °[employer]® Patient Information ©2014 [employer]. ° °

## 2013-04-10 ENCOUNTER — Encounter (HOSPITAL_COMMUNITY): Payer: Self-pay | Admitting: *Deleted

## 2013-04-10 ENCOUNTER — Inpatient Hospital Stay (HOSPITAL_COMMUNITY)
Admission: AD | Admit: 2013-04-10 | Discharge: 2013-04-10 | Disposition: A | Discharge status: Home / Self Care | Payer: 59 | Source: Ambulatory Visit | Attending: Obstetrics and Gynecology | Admitting: Obstetrics and Gynecology

## 2013-04-10 DIAGNOSIS — R51 Headache: Secondary | ICD-10-CM | POA: Insufficient documentation

## 2013-04-10 DIAGNOSIS — O99893 Other specified diseases and conditions complicating puerperium: Secondary | ICD-10-CM | POA: Insufficient documentation

## 2013-04-10 DIAGNOSIS — R030 Elevated blood-pressure reading, without diagnosis of hypertension: Secondary | ICD-10-CM | POA: Insufficient documentation

## 2013-04-10 DIAGNOSIS — O9989 Other specified diseases and conditions complicating pregnancy, childbirth and the puerperium: Principal | ICD-10-CM

## 2013-04-10 LAB — COMPREHENSIVE METABOLIC PANEL
ALT: 31 U/L (ref 0–35)
AST: 24 U/L (ref 0–37)
Albumin: 2.7 g/dL — ABNORMAL LOW (ref 3.5–5.2)
Alkaline Phosphatase: 129 U/L — ABNORMAL HIGH (ref 39–117)
BUN: 10 mg/dL (ref 6–23)
CALCIUM: 9.2 mg/dL (ref 8.4–10.5)
CO2: 26 mEq/L (ref 19–32)
Chloride: 102 mEq/L (ref 96–112)
Creatinine, Ser: 0.84 mg/dL (ref 0.50–1.10)
GFR calc non Af Amer: 85 mL/min — ABNORMAL LOW (ref >90)
GLUCOSE: 78 mg/dL (ref 70–99)
Potassium: 4.3 mEq/L (ref 3.7–5.3)
SODIUM: 141 meq/L (ref 137–147)
TOTAL PROTEIN: 7 g/dL (ref 6.0–8.3)
Total Bilirubin: 0.4 mg/dL (ref 0.3–1.2)

## 2013-04-10 LAB — CBC
HCT: 23.9 % — ABNORMAL LOW (ref 36.0–46.0)
HEMOGLOBIN: 8 g/dL — AB (ref 12.0–15.0)
MCH: 26.4 pg (ref 26.0–34.0)
MCHC: 33.5 g/dL (ref 30.0–36.0)
MCV: 78.9 fL (ref 78.0–100.0)
Platelets: 444 10*3/uL — ABNORMAL HIGH (ref 150–400)
RBC: 3.03 MIL/uL — AB (ref 3.87–5.11)
RDW: 15.3 % (ref 11.5–15.5)
WBC: 9.6 10*3/uL (ref 4.0–10.5)

## 2013-04-10 LAB — URINALYSIS, ROUTINE W REFLEX MICROSCOPIC
Bilirubin Urine: NEGATIVE
GLUCOSE, UA: NEGATIVE mg/dL
Ketones, ur: NEGATIVE mg/dL
Nitrite: NEGATIVE
PROTEIN: NEGATIVE mg/dL
Specific Gravity, Urine: <1.005 — ABNORMAL LOW (ref 1.005–1.030)
Urobilinogen, UA: 0.2 mg/dL (ref 0.0–1.0)
pH: 7 (ref 5.0–8.0)

## 2013-04-10 LAB — URINE MICROSCOPIC-ADD ON

## 2013-04-10 LAB — PROTEIN / CREATININE RATIO, URINE
Creatinine, Urine: 43.29 mg/dL
Protein Creatinine Ratio: 0.18 — ABNORMAL HIGH (ref 0.00–0.15)
TOTAL PROTEIN, URINE: 7.6 mg/dL

## 2013-04-10 LAB — URIC ACID: URIC ACID, SERUM: 7.6 mg/dL — AB (ref 2.4–7.0)

## 2013-04-10 LAB — LACTATE DEHYDROGENASE: LDH: 240 U/L (ref 94–250)

## 2013-04-10 MED ORDER — IBUPROFEN 600 MG PO TABS
600.0000 mg | ORAL_TABLET | Freq: Once | ORAL | Status: AC
  Administered 2013-04-10: 600 mg via ORAL
  Filled 2013-04-10: qty 1

## 2013-04-10 MED ORDER — LABETALOL HCL 100 MG PO TABS
200.0000 mg | ORAL_TABLET | Freq: Two times a day (BID) | ORAL | Status: DC
  Administered 2013-04-10: 200 mg via ORAL
  Filled 2013-04-10: qty 2

## 2013-04-10 MED ORDER — LABETALOL HCL 200 MG PO TABS: 200.0000 mg | ORAL_TABLET | Freq: Two times a day (BID) | ORAL | Status: DC

## 2013-04-10 MED ORDER — TRAMADOL HCL 50 MG PO TABS
50.0000 mg | ORAL_TABLET | Freq: Once | ORAL | Status: AC
  Administered 2013-04-10: 50 mg via ORAL
  Filled 2013-04-10: qty 1

## 2013-04-10 NOTE — MAU Note (Signed)
Pt reports the nurse came to house and her b/p was elevated 160's/90, 150's/90. Post partum 04/03/13 c-section. C/O headache and abd pain(from c-section incision).
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
## 2013-04-10 NOTE — Discharge Instructions (Signed)
Take Labetalol (blood pressure medication every 12 hours). Take pain medication as needed for pain and headache. Call for any concerns or questions. Office will call you on Monday to set up time to be seen (likely by Dr. Normand Sloop).  Hypertension During Pregnancy Hypertension is also called high blood pressure. It can occur at any time in life and during pregnancy. When you have hypertension, there is extra pressure inside your blood vessels that carry blood from the heart to the rest of your body (arteries). Hypertension during pregnancy can cause problems for you.  Different types of hypertension can occur during pregnancy.   Chronic hypertension. This happens when a woman has hypertension before pregnancy and it continues during pregnancy.  Gestational hypertension. This is when hypertension develops during pregnancy.  Preeclampsia or toxemia of pregnancy. This is a very serious type of hypertension that develops only during pregnancy. It is a disease that affects the whole body (systemic) and can be very dangerous for both mother and baby.  Gestational hypertension and preeclampsia usually go away after your baby is born. Blood pressure generally stabilizes within 6 weeks. Women who have hypertension during pregnancy have a greater chance of developing hypertension later in life or with future pregnancies. RISK FACTORS Some factors make you more likely to develop hypertension during pregnancy. Risk factors include:  Having hypertension before pregnancy.  Having hypertension during a previous pregnancy.  Being overweight.  Being older than 40.  Being pregnant with more than one baby (multiples).  Having diabetes or kidney problems. SIGNS AND SYMPTOMS Chronic and gestational hypertension rarely cause symptoms. Preeclampsia has symptoms, which may include:  Increased protein in your urine. Your health care provider will check for this at every prenatal visit.  Swelling of your hands  and face.  Rapid weight gain.  Headaches.  Visual changes.  Being bothered by light.  Abdominal pain, especially in the right upper area.  Chest pain.  Shortness of breath.  Increased reflexes.  Seizures. Seizures occur with a more severe form of preeclampsia, called eclampsia. DIAGNOSIS   You may be diagnosed with hypertension during a regular prenatal exam. At each visit, tests may include:  Blood pressure checks.  A urine test to check for protein in your urine.  The type of hypertension you are diagnosed with depends on when you developed it. It also depends on your specific blood pressure reading.  Developing hypertension before 20 weeks of pregnancy is consistent with chronic hypertension.  Developing hypertension after 20 weeks of pregnancy is consistent with gestational hypertension.  Hypertension with increased urinary protein is diagnosed as preeclampsia.  Blood pressure measurements that stay above 160 systolic or 110 diastolic are a sign of severe preeclampsia. TREATMENT Treatment for hypertension during pregnancy varies. Treatment depends on the type of hypertension and how serious it is.  If you take medicine for chronic hypertension, you may need to switch medicines.  Drugs called ACE inhibitors should not be taken during pregnancy.  Low-dose aspirin may be suggested for women who have risk factors for preeclampsia.  If you have gestational hypertension, you may need to take a blood pressure medicine that is safe during pregnancy. Your health care provider will recommend the appropriate medicine.  If you have severe preeclampsia, you may need to be in the hospital. Health care providers will watch you and the baby very closely. You also may need to take medicine (magnesium sulfate) to prevent seizures and lower blood pressure.  Sometimes an early delivery is needed. This may  be the case if the condition worsens. It would be done to protect you and the  baby. The only cure for preeclampsia is delivery. HOME CARE INSTRUCTIONS  Schedule and keep all of your regular appointments for prenatal care.  Only take over-the-counter or prescription medicines as directed by your health care provider. Tell your health care provider about all medicines you take.  Eat as little salt as possible.  Get regular exercise.  Do not drink alcohol.  Do not use tobacco products.  Do not drink products with caffeine.  Lie on your left side when resting. SEEK IMMEDIATE MEDICAL CARE IF:  You have severe abdominal pain.  You have sudden swelling in the hands, ankles, or face.  You gain 4 pounds (1.8 kg) or more in 1 week.  You vomit repeatedly.  You have vaginal bleeding.  You do not feel the baby moving as much.  You have a headache.  You have blurred or double vision.  You have muscle twitching or spasms.  You have shortness of breath.  You have blue fingernails and lips.  You have blood in your urine. MAKE SURE YOU:  Understand these instructions.  Will watch your condition.  Will get help right away if you are not doing well or get worse. Document Released: 11/28/2010 Document Revised: 12/31/2012 Document Reviewed: 10/09/2012 [facility] Patient Information 2014 [employer], [place].

## 2013-04-11 ENCOUNTER — Encounter (HOSPITAL_COMMUNITY): Payer: Self-pay | Admitting: Obstetrics and Gynecology

## 2013-04-11 LAB — URINE CULTURE

## 2014-01-21 ENCOUNTER — Other Ambulatory Visit: Payer: Self-pay | Admitting: Obstetrics and Gynecology

## 2014-01-21 DIAGNOSIS — Z1231 Encounter for screening mammogram for malignant neoplasm of breast: Secondary | ICD-10-CM

## 2014-01-25 ENCOUNTER — Encounter (HOSPITAL_COMMUNITY): Payer: Self-pay | Admitting: Obstetrics and Gynecology

## 2014-02-23 ENCOUNTER — Ambulatory Visit
Admission: RE | Admit: 2014-02-23 | Discharge: 2014-02-23 | Disposition: A | Discharge status: Home / Self Care | Payer: 59 | Source: Ambulatory Visit | Attending: Obstetrics and Gynecology | Admitting: Obstetrics and Gynecology

## 2014-02-23 DIAGNOSIS — Z1231 Encounter for screening mammogram for malignant neoplasm of breast: Secondary | ICD-10-CM

## 2018-02-03 LAB — OB RESULTS CONSOLE RPR: RPR: NONREACTIVE

## 2018-02-03 LAB — OB RESULTS CONSOLE GC/CHLAMYDIA
Chlamydia: NEGATIVE
Gonorrhea: NEGATIVE

## 2018-02-03 LAB — OB RESULTS CONSOLE HIV ANTIBODY (ROUTINE TESTING): HIV: NONREACTIVE

## 2018-02-03 LAB — OB RESULTS CONSOLE RUBELLA ANTIBODY, IGM: Rubella: IMMUNE

## 2018-02-03 LAB — OB RESULTS CONSOLE ANTIBODY SCREEN: Antibody Screen: NEGATIVE

## 2018-02-03 LAB — OB RESULTS CONSOLE HEPATITIS B SURFACE ANTIGEN: Hepatitis B Surface Ag: NEGATIVE

## 2018-02-03 LAB — OB RESULTS CONSOLE ABO/RH: RH Type: POSITIVE

## 2018-06-09 ENCOUNTER — Other Ambulatory Visit: Payer: Self-pay | Admitting: Obstetrics and Gynecology

## 2018-08-04 ENCOUNTER — Encounter (HOSPITAL_COMMUNITY): Payer: Self-pay | Admitting: *Deleted

## 2018-08-06 ENCOUNTER — Encounter (HOSPITAL_COMMUNITY): Payer: Self-pay | Admitting: *Deleted

## 2018-08-06 NOTE — Patient Instructions (Signed)
Edit Lori Watts  08/06/2018   Your procedure is scheduled on:  08/14/2018  Arrive at 1000 at Entrance C on [address] at [facility]  and [employer]. You are invited to use the FREE valet parking or use the Visitor's parking deck.  Pick up the phone at the desk and dial [phone].  Call this number if you have problems the morning of surgery: [phone]  Remember:   Do not eat food:(After Midnight) Desps de medianoche.  Do not drink clear liquids: (After Midnight) Desps de medianoche.  Take these medicines the morning of surgery with A SIP OF WATER:  none   Do not wear jewelry, make-up or nail polish.  Do not wear lotions, powders, or perfumes. Do not wear deodorant.  Do not shave 48 hours prior to surgery.  Do not bring valuables to the hospital.  [facility] is not   responsible for any belongings or valuables brought to the hospital.  Contacts, dentures or bridgework may not be worn into surgery.  Leave suitcase in the car. After surgery it may be brought to your room.  For patients admitted to the hospital, checkout time is 11:00 AM the day of              discharge.      Please read over the following fact sheets that you were given:     Preparing for Surgery

## 2018-08-12 ENCOUNTER — Other Ambulatory Visit: Payer: Self-pay

## 2018-08-12 ENCOUNTER — Other Ambulatory Visit (HOSPITAL_COMMUNITY)
Admission: RE | Admit: 2018-08-12 | Discharge: 2018-08-12 | Disposition: A | Discharge status: Home / Self Care | Payer: 59 | Source: Ambulatory Visit | Attending: Obstetrics & Gynecology | Admitting: Obstetrics & Gynecology

## 2018-08-12 DIAGNOSIS — Z1159 Encounter for screening for other viral diseases: Secondary | ICD-10-CM | POA: Insufficient documentation
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
## 2018-08-12 NOTE — MAU Note (Signed)
Asymptomatic.  Swab collected.  (pt wearing gloves, carrying phone.... discussed transmission I.e. everything you touch then touch your phone and your clothing... pt verbalized understanding, asked to use hand sanitizer and for another pair of gloves.

## 2018-08-13 LAB — NOVEL CORONAVIRUS, NAA (HOSP ORDER, SEND-OUT TO REF LAB; TAT 18-24 HRS): SARS-CoV-2, NAA: NOT DETECTED

## 2018-08-13 NOTE — Anesthesia Preprocedure Evaluation (Addendum)
Anesthesia Evaluation  Patient identified by MRN, date of birth, ID band Patient awake    Reviewed: Allergy & Precautions, NPO status , Patient's Chart, lab work & pertinent test results  History of Anesthesia Complications (+) POST - OP SPINAL HEADACHENegative for: history of anesthetic complications (Pt had Severe PDPH w firat two c-sections none with last 2)  Airway Mallampati: II  TM Distance: >3 FB Neck ROM: Full    Dental no notable dental hx. (+) Teeth Intact, Dental Advisory Given   Pulmonary neg pulmonary ROS,    Pulmonary exam normal breath sounds clear to auscultation       Cardiovascular Exercise Tolerance: Good Normal cardiovascular exam Rhythm:Regular Rate:Normal  Gestational HTN w last pregnancy none today   Neuro/Psych  Headaches, negative psych ROS   GI/Hepatic negative GI ROS, Neg liver ROS,   Endo/Other  diabetes  Renal/GU negative Renal ROS     Musculoskeletal   Abdominal   Peds  Hematology  (+) anemia , hgb 10.9 plt 241   Anesthesia Other Findings All: Latex  Reproductive/Obstetrics (+) Pregnancy CS x4                           Anesthesia Physical Anesthesia Plan  ASA: III  Anesthesia Plan: Spinal and Epidural   Post-op Pain Management:    Induction:   PONV Risk Score and Plan: 3 and Treatment may vary due to age or medical condition, Ondansetron and Dexamethasone  Airway Management Planned: Simple Face Mask and Natural Airway  Additional Equipment:   Intra-op Plan:   Post-operative Plan:   Informed Consent: I have reviewed the patients History and Physical, chart, labs and discussed the procedure including the risks, benefits and alternatives for the proposed anesthesia with the patient or authorized representative who has indicated his/her understanding and acceptance.     Dental advisory given  Plan Discussed with: CRNA and Surgeon  Anesthesia  Plan Comments: (C/S under CSE ( T x C (2) in room for incision))     Anesthesia Quick Evaluation

## 2018-08-14 ENCOUNTER — Encounter (HOSPITAL_COMMUNITY)
Admission: RE | Disposition: A | Discharge status: Home / Self Care | Payer: Self-pay | Source: Home / Self Care | Attending: Obstetrics & Gynecology

## 2018-08-14 ENCOUNTER — Inpatient Hospital Stay (HOSPITAL_COMMUNITY): Payer: 59 | Admitting: Anesthesiology

## 2018-08-14 ENCOUNTER — Inpatient Hospital Stay (HOSPITAL_COMMUNITY)
Admission: RE | Admit: 2018-08-14 | Discharge: 2018-08-18 | DRG: 787 | Disposition: A | Discharge status: Home / Self Care | Payer: 59 | Attending: Obstetrics & Gynecology | Admitting: Obstetrics & Gynecology

## 2018-08-14 ENCOUNTER — Other Ambulatory Visit: Payer: Self-pay

## 2018-08-14 ENCOUNTER — Encounter (HOSPITAL_COMMUNITY): Payer: Self-pay | Admitting: *Deleted

## 2018-08-14 DIAGNOSIS — D62 Acute posthemorrhagic anemia: Secondary | ICD-10-CM | POA: Diagnosis not present

## 2018-08-14 DIAGNOSIS — O34211 Maternal care for low transverse scar from previous cesarean delivery: Secondary | ICD-10-CM | POA: Diagnosis present

## 2018-08-14 DIAGNOSIS — Z3A39 39 weeks gestation of pregnancy: Secondary | ICD-10-CM

## 2018-08-14 DIAGNOSIS — O9081 Anemia of the puerperium: Secondary | ICD-10-CM | POA: Diagnosis not present

## 2018-08-14 LAB — CBC
HCT: 32.6 % — ABNORMAL LOW (ref 36.0–46.0)
HCT: 23.2 % — ABNORMAL LOW (ref 36.0–46.0)
Hemoglobin: 10.9 g/dL — ABNORMAL LOW (ref 12.0–15.0)
Hemoglobin: 7.7 g/dL — ABNORMAL LOW (ref 12.0–15.0)
MCH: 28.4 pg (ref 26.0–34.0)
MCH: 27.8 pg (ref 26.0–34.0)
MCHC: 33.4 g/dL (ref 30.0–36.0)
MCHC: 33.2 g/dL (ref 30.0–36.0)
MCV: 84.9 fL (ref 80.0–100.0)
MCV: 83.8 fL (ref 80.0–100.0)
Platelets: 241 10*3/uL (ref 150–400)
Platelets: 211 10*3/uL (ref 150–400)
RBC: 3.84 MIL/uL — ABNORMAL LOW (ref 3.87–5.11)
RBC: 2.77 MIL/uL — ABNORMAL LOW (ref 3.87–5.11)
RDW: 14.7 % (ref 11.5–15.5)
RDW: 14.5 % (ref 11.5–15.5)
WBC: 11.2 10*3/uL — ABNORMAL HIGH (ref 4.0–10.5)
WBC: 18.4 10*3/uL — ABNORMAL HIGH (ref 4.0–10.5)
nRBC: 0 % (ref 0.0–0.2)
nRBC: 0 % (ref 0.0–0.2)

## 2018-08-14 LAB — PREPARE RBC (CROSSMATCH)

## 2018-08-14 SURGERY — Surgical Case
Anesthesia: Spinal

## 2018-08-14 MED ORDER — FERROUS SULFATE 325 (65 FE) MG PO TABS
325.0000 mg | ORAL_TABLET | Freq: Two times a day (BID) | ORAL | Status: DC
  Administered 2018-08-15 – 2018-08-17 (×3): 325 mg via ORAL
  Filled 2018-08-14 (×3): qty 1

## 2018-08-14 MED ORDER — DIPHENHYDRAMINE HCL 25 MG PO CAPS: 25.0000 mg | ORAL_CAPSULE | Freq: Four times a day (QID) | ORAL | Status: DC | PRN

## 2018-08-14 MED ORDER — SODIUM CHLORIDE 0.9 % IV SOLN
INTRAVENOUS | Status: DC | PRN
  Administered 2018-08-14: 40 [IU] via INTRAVENOUS

## 2018-08-14 MED ORDER — OXYTOCIN 40 UNITS IN NORMAL SALINE INFUSION - SIMPLE MED: 2.5000 [IU]/h | INTRAVENOUS | Status: AC

## 2018-08-14 MED ORDER — METHYLERGONOVINE MALEATE 0.2 MG PO TABS: 0.2000 mg | ORAL_TABLET | ORAL | Status: DC | PRN

## 2018-08-14 MED ORDER — SIMETHICONE 80 MG PO CHEW
80.0000 mg | CHEWABLE_TABLET | Freq: Three times a day (TID) | ORAL | Status: DC
  Administered 2018-08-15 – 2018-08-18 (×8): 80 mg via ORAL
  Filled 2018-08-14 (×9): qty 1

## 2018-08-14 MED ORDER — SODIUM CHLORIDE 0.9% IV SOLUTION: Freq: Once | INTRAVENOUS | Status: DC

## 2018-08-14 MED ORDER — LACTATED RINGERS IV SOLN
INTRAVENOUS | Status: DC
  Administered 2018-08-14 (×4): via INTRAVENOUS

## 2018-08-14 MED ORDER — SODIUM CHLORIDE 0.9 % IV SOLN: INTRAVENOUS | Status: DC | PRN

## 2018-08-14 MED ORDER — NALBUPHINE HCL 10 MG/ML IJ SOLN: 5.0000 mg | Freq: Once | INTRAMUSCULAR | Status: DC | PRN

## 2018-08-14 MED ORDER — DIPHENHYDRAMINE HCL 50 MG/ML IJ SOLN
INTRAMUSCULAR | Status: DC | PRN
  Administered 2018-08-14 (×2): 25 mg via INTRAVENOUS

## 2018-08-14 MED ORDER — DEXAMETHASONE SODIUM PHOSPHATE 4 MG/ML IJ SOLN
INTRAMUSCULAR | Status: DC | PRN
  Administered 2018-08-14: 4 mg via INTRAVENOUS

## 2018-08-14 MED ORDER — LACTATED RINGERS IV SOLN
INTRAVENOUS | Status: DC
  Administered 2018-08-14 – 2018-08-15 (×3): via INTRAVENOUS

## 2018-08-14 MED ORDER — BUPIVACAINE IN DEXTROSE 0.75-8.25 % IT SOLN
INTRATHECAL | Status: DC | PRN
  Administered 2018-08-14: 1.7 mg via INTRATHECAL

## 2018-08-14 MED ORDER — KETOROLAC TROMETHAMINE 30 MG/ML IJ SOLN
30.0000 mg | Freq: Four times a day (QID) | INTRAMUSCULAR | Status: AC | PRN
  Administered 2018-08-14 – 2018-08-15 (×3): 30 mg via INTRAVENOUS
  Filled 2018-08-14 (×4): qty 1

## 2018-08-14 MED ORDER — FERROUS SULFATE 325 (65 FE) MG PO TABS
325.0000 mg | ORAL_TABLET | Freq: Every day | ORAL | Status: DC
  Administered 2018-08-15 – 2018-08-16 (×2): 325 mg via ORAL
  Filled 2018-08-14 (×3): qty 1

## 2018-08-14 MED ORDER — WITCH HAZEL-GLYCERIN EX PADS: 1.0000 "application " | MEDICATED_PAD | CUTANEOUS | Status: DC | PRN

## 2018-08-14 MED ORDER — KETOROLAC TROMETHAMINE 30 MG/ML IJ SOLN: 30.0000 mg | Freq: Four times a day (QID) | INTRAMUSCULAR | Status: AC | PRN

## 2018-08-14 MED ORDER — SODIUM CHLORIDE 0.9 % IV SOLN
INTRAVENOUS | Status: DC | PRN
  Administered 2018-08-14: 60 ug/min via INTRAVENOUS

## 2018-08-14 MED ORDER — TETANUS-DIPHTH-ACELL PERTUSSIS 5-2.5-18.5 LF-MCG/0.5 IM SUSP: 0.5000 mL | Freq: Once | INTRAMUSCULAR | Status: DC

## 2018-08-14 MED ORDER — SIMETHICONE 80 MG PO CHEW
80.0000 mg | CHEWABLE_TABLET | ORAL | Status: DC
  Administered 2018-08-14 – 2018-08-17 (×4): 80 mg via ORAL
  Filled 2018-08-14 (×4): qty 1

## 2018-08-14 MED ORDER — SODIUM BICARBONATE 8.4 % IV SOLN
INTRAVENOUS | Status: AC
  Filled 2018-08-14: qty 50

## 2018-08-14 MED ORDER — CEFAZOLIN SODIUM-DEXTROSE 2-4 GM/100ML-% IV SOLN
INTRAVENOUS | Status: AC
  Filled 2018-08-14: qty 100

## 2018-08-14 MED ORDER — DIPHENHYDRAMINE HCL 50 MG/ML IJ SOLN: 12.5000 mg | INTRAMUSCULAR | Status: DC | PRN

## 2018-08-14 MED ORDER — SODIUM CHLORIDE 0.9 % IV SOLN
INTRAVENOUS | Status: DC | PRN
  Administered 2018-08-14: 14:00:00 via INTRAVENOUS

## 2018-08-14 MED ORDER — ALBUMIN HUMAN 5 % IV SOLN
INTRAVENOUS | Status: DC | PRN
  Administered 2018-08-14 (×2): via INTRAVENOUS

## 2018-08-14 MED ORDER — BUPIVACAINE HCL (PF) 0.25 % IJ SOLN
INTRAMUSCULAR | Status: DC | PRN
  Administered 2018-08-14: 20 mL

## 2018-08-14 MED ORDER — FENTANYL CITRATE (PF) 100 MCG/2ML IJ SOLN
INTRAMUSCULAR | Status: DC | PRN
  Administered 2018-08-14: 15 ug via INTRATHECAL

## 2018-08-14 MED ORDER — PRENATAL MULTIVITAMIN CH
1.0000 | ORAL_TABLET | Freq: Every day | ORAL | Status: DC
  Administered 2018-08-15 – 2018-08-17 (×2): 1 via ORAL
  Filled 2018-08-14 (×3): qty 1

## 2018-08-14 MED ORDER — ZOLPIDEM TARTRATE 5 MG PO TABS: 5.0000 mg | ORAL_TABLET | Freq: Every evening | ORAL | Status: DC | PRN

## 2018-08-14 MED ORDER — ACETAMINOPHEN 160 MG/5ML PO SOLN: 325.0000 mg | ORAL | Status: DC | PRN

## 2018-08-14 MED ORDER — NALOXONE HCL 0.4 MG/ML IJ SOLN: 0.4000 mg | INTRAMUSCULAR | Status: DC | PRN

## 2018-08-14 MED ORDER — SIMETHICONE 80 MG PO CHEW
80.0000 mg | CHEWABLE_TABLET | ORAL | Status: DC | PRN
  Administered 2018-08-17: 80 mg via ORAL
  Filled 2018-08-14 (×2): qty 1

## 2018-08-14 MED ORDER — NALBUPHINE HCL 10 MG/ML IJ SOLN
INTRAMUSCULAR | Status: AC
  Filled 2018-08-14: qty 1

## 2018-08-14 MED ORDER — ONDANSETRON HCL 4 MG/2ML IJ SOLN: 4.0000 mg | Freq: Three times a day (TID) | INTRAMUSCULAR | Status: DC | PRN

## 2018-08-14 MED ORDER — ONDANSETRON HCL 4 MG/2ML IJ SOLN
INTRAMUSCULAR | Status: AC
  Filled 2018-08-14: qty 2

## 2018-08-14 MED ORDER — SODIUM CHLORIDE 0.9% FLUSH: 3.0000 mL | INTRAVENOUS | Status: DC | PRN

## 2018-08-14 MED ORDER — ONDANSETRON HCL 4 MG/2ML IJ SOLN
INTRAMUSCULAR | Status: DC | PRN
  Administered 2018-08-14: 4 mg via INTRAVENOUS

## 2018-08-14 MED ORDER — FENTANYL CITRATE (PF) 100 MCG/2ML IJ SOLN
INTRAMUSCULAR | Status: AC
  Filled 2018-08-14: qty 2

## 2018-08-14 MED ORDER — CEFAZOLIN SODIUM-DEXTROSE 2-4 GM/100ML-% IV SOLN
2.0000 g | INTRAVENOUS | Status: AC
  Administered 2018-08-14: 2 g via INTRAVENOUS

## 2018-08-14 MED ORDER — NALBUPHINE HCL 10 MG/ML IJ SOLN
5.0000 mg | INTRAMUSCULAR | Status: DC | PRN
  Administered 2018-08-14: 5 mg via SUBCUTANEOUS

## 2018-08-14 MED ORDER — DEXAMETHASONE SODIUM PHOSPHATE 10 MG/ML IJ SOLN
INTRAMUSCULAR | Status: AC
  Filled 2018-08-14: qty 1

## 2018-08-14 MED ORDER — HYDROMORPHONE HCL 1 MG/ML IJ SOLN: 0.2500 mg | INTRAMUSCULAR | Status: DC | PRN

## 2018-08-14 MED ORDER — KETOROLAC TROMETHAMINE 30 MG/ML IJ SOLN
30.0000 mg | Freq: Once | INTRAMUSCULAR | Status: AC | PRN
  Administered 2018-08-14: 30 mg via INTRAVENOUS

## 2018-08-14 MED ORDER — OXYCODONE HCL 5 MG PO TABS: 5.0000 mg | ORAL_TABLET | ORAL | Status: DC | PRN

## 2018-08-14 MED ORDER — MEPERIDINE HCL 25 MG/ML IJ SOLN: 6.2500 mg | INTRAMUSCULAR | Status: DC | PRN

## 2018-08-14 MED ORDER — DIPHENHYDRAMINE HCL 25 MG PO CAPS
25.0000 mg | ORAL_CAPSULE | ORAL | Status: DC | PRN
  Administered 2018-08-14 – 2018-08-15 (×2): 25 mg via ORAL
  Filled 2018-08-14 (×2): qty 1

## 2018-08-14 MED ORDER — SODIUM BICARBONATE 8.4 % IV SOLN
INTRAVENOUS | Status: DC | PRN
  Administered 2018-08-14: 5 mL via EPIDURAL
  Administered 2018-08-14: 3 mL via EPIDURAL
  Administered 2018-08-14: 2 mL via EPIDURAL
  Administered 2018-08-14: 5 mL via EPIDURAL

## 2018-08-14 MED ORDER — ACETAMINOPHEN 500 MG PO TABS
1000.0000 mg | ORAL_TABLET | Freq: Four times a day (QID) | ORAL | Status: DC
  Administered 2018-08-15 – 2018-08-18 (×10): 1000 mg via ORAL
  Filled 2018-08-14 (×12): qty 2

## 2018-08-14 MED ORDER — MORPHINE SULFATE (PF) 0.5 MG/ML IJ SOLN
INTRAMUSCULAR | Status: AC
  Filled 2018-08-14: qty 10

## 2018-08-14 MED ORDER — PHENYLEPHRINE HCL-NACL 20-0.9 MG/250ML-% IV SOLN
INTRAVENOUS | Status: AC
  Filled 2018-08-14: qty 250

## 2018-08-14 MED ORDER — MORPHINE SULFATE (PF) 0.5 MG/ML IJ SOLN
INTRAMUSCULAR | Status: DC | PRN
  Administered 2018-08-14: .15 mg via INTRATHECAL

## 2018-08-14 MED ORDER — SENNOSIDES-DOCUSATE SODIUM 8.6-50 MG PO TABS
2.0000 | ORAL_TABLET | ORAL | Status: DC
  Administered 2018-08-14 – 2018-08-17 (×4): 2 via ORAL
  Filled 2018-08-14 (×4): qty 2

## 2018-08-14 MED ORDER — MENTHOL 3 MG MT LOZG: 1.0000 | LOZENGE | OROMUCOSAL | Status: DC | PRN

## 2018-08-14 MED ORDER — SCOPOLAMINE 1 MG/3DAYS TD PT72
1.0000 | MEDICATED_PATCH | Freq: Once | TRANSDERMAL | Status: AC
  Administered 2018-08-14: 1.5 mg via TRANSDERMAL

## 2018-08-14 MED ORDER — MEASLES, MUMPS & RUBELLA VAC IJ SOLR: 0.5000 mL | Freq: Once | INTRAMUSCULAR | Status: DC

## 2018-08-14 MED ORDER — BUPIVACAINE HCL (PF) 0.25 % IJ SOLN
INTRAMUSCULAR | Status: AC
  Filled 2018-08-14: qty 30

## 2018-08-14 MED ORDER — KETOROLAC TROMETHAMINE 30 MG/ML IJ SOLN
INTRAMUSCULAR | Status: AC
  Filled 2018-08-14: qty 1

## 2018-08-14 MED ORDER — NALBUPHINE HCL 10 MG/ML IJ SOLN: 5.0000 mg | INTRAMUSCULAR | Status: DC | PRN

## 2018-08-14 MED ORDER — METHYLERGONOVINE MALEATE 0.2 MG/ML IJ SOLN: 0.2000 mg | INTRAMUSCULAR | Status: DC | PRN

## 2018-08-14 MED ORDER — ALBUMIN HUMAN 5 % IV SOLN
INTRAVENOUS | Status: AC
  Filled 2018-08-14: qty 500

## 2018-08-14 MED ORDER — OXYTOCIN 40 UNITS IN NORMAL SALINE INFUSION - SIMPLE MED
INTRAVENOUS | Status: AC
  Filled 2018-08-14: qty 1000

## 2018-08-14 MED ORDER — NALOXONE HCL 4 MG/10ML IJ SOLN
1.0000 ug/kg/h | INTRAVENOUS | Status: DC | PRN
  Filled 2018-08-14: qty 5

## 2018-08-14 MED ORDER — LIDOCAINE-EPINEPHRINE (PF) 2 %-1:200000 IJ SOLN
INTRAMUSCULAR | Status: AC
  Filled 2018-08-14: qty 10

## 2018-08-14 MED ORDER — SCOPOLAMINE 1 MG/3DAYS TD PT72
MEDICATED_PATCH | TRANSDERMAL | Status: AC
  Filled 2018-08-14: qty 1

## 2018-08-14 MED ORDER — DIBUCAINE (PERIANAL) 1 % EX OINT: 1.0000 "application " | TOPICAL_OINTMENT | CUTANEOUS | Status: DC | PRN

## 2018-08-14 MED ORDER — OXYCODONE HCL 5 MG/5ML PO SOLN: 5.0000 mg | Freq: Once | ORAL | Status: DC | PRN

## 2018-08-14 MED ORDER — ACETAMINOPHEN 325 MG PO TABS: 325.0000 mg | ORAL_TABLET | ORAL | Status: DC | PRN

## 2018-08-14 MED ORDER — DIPHENHYDRAMINE HCL 50 MG/ML IJ SOLN
INTRAMUSCULAR | Status: AC
  Filled 2018-08-14: qty 1

## 2018-08-14 MED ORDER — OXYCODONE HCL 5 MG PO TABS: 5.0000 mg | ORAL_TABLET | Freq: Once | ORAL | Status: DC | PRN

## 2018-08-14 MED ORDER — COCONUT OIL OIL: 1.0000 "application " | TOPICAL_OIL | Status: DC | PRN

## 2018-08-14 SURGICAL SUPPLY — 40 items
BENZOIN TINCTURE PRP APPL 2/3 (GAUZE/BANDAGES/DRESSINGS) ×2 IMPLANT
BOOTIES KNEE HIGH SLOAN (MISCELLANEOUS) ×4 IMPLANT
CHLORAPREP W/TINT 26ML (MISCELLANEOUS) ×2 IMPLANT
CLAMP CORD UMBIL (MISCELLANEOUS) IMPLANT
CLOTH BEACON ORANGE TIMEOUT ST (SAFETY) ×2 IMPLANT
DRAIN JACKSON PRT FLT 10 (DRAIN) IMPLANT
DRSG OPSITE POSTOP 4X10 (GAUZE/BANDAGES/DRESSINGS) ×2 IMPLANT
ELECT REM PT RETURN 9FT ADLT (ELECTROSURGICAL) ×2
ELECTRODE REM PT RTRN 9FT ADLT (ELECTROSURGICAL) ×1 IMPLANT
EVACUATOR SILICONE 100CC (DRAIN) IMPLANT
EXTRACTOR VACUUM M CUP 4 TUBE (SUCTIONS) IMPLANT
GAUZE SPONGE 4X4 12PLY STRL LF (GAUZE/BANDAGES/DRESSINGS) ×4 IMPLANT
GLOVE BIOGEL PI IND STRL 7.0 (GLOVE) ×2 IMPLANT
GLOVE BIOGEL PI INDICATOR 7.0 (GLOVE) ×2
GLOVE ECLIPSE 6.5 STRL STRAW (GLOVE) ×2 IMPLANT
GOWN STRL REUS W/TWL LRG LVL3 (GOWN DISPOSABLE) ×4 IMPLANT
HEMOSTAT ARISTA ABSORB 3G PWDR (HEMOSTASIS) ×2 IMPLANT
KIT ABG SYR 3ML LUER SLIP (SYRINGE) IMPLANT
NEEDLE HYPO 22GX1.5 SAFETY (NEEDLE) ×2 IMPLANT
NEEDLE HYPO 25X5/8 SAFETYGLIDE (NEEDLE) IMPLANT
NS IRRIG 1000ML POUR BTL (IV SOLUTION) ×4 IMPLANT
PACK C SECTION WH (CUSTOM PROCEDURE TRAY) ×2 IMPLANT
PAD ABD 7.5X8 STRL (GAUZE/BANDAGES/DRESSINGS) ×2 IMPLANT
PAD OB MATERNITY 4.3X12.25 (PERSONAL CARE ITEMS) ×2 IMPLANT
PENCIL SMOKE EVAC W/HOLSTER (ELECTROSURGICAL) ×2 IMPLANT
RTRCTR C-SECT PINK 25CM LRG (MISCELLANEOUS) ×2 IMPLANT
SPONGE SURGIFOAM ABS GEL 12-7 (HEMOSTASIS) ×2 IMPLANT
STRIP CLOSURE SKIN 1/2X4 (GAUZE/BANDAGES/DRESSINGS) ×2 IMPLANT
SUT MNCRL AB 3-0 PS2 27 (SUTURE) ×2 IMPLANT
SUT PLAIN 2 0 XLH (SUTURE) ×2 IMPLANT
SUT SILK 2 0 FSL 18 (SUTURE) IMPLANT
SUT VIC AB 0 CTX 36 (SUTURE) ×12
SUT VIC AB 0 CTX36XBRD ANBCTRL (SUTURE) ×12 IMPLANT
SUT VIC AB 1 CT1 36 (SUTURE) ×4 IMPLANT
SUT VIC AB 2-0 CT1 27 (SUTURE) ×2
SUT VIC AB 2-0 CT1 TAPERPNT 27 (SUTURE) ×2 IMPLANT
SYR 20CC LL (SYRINGE) ×2 IMPLANT
TOWEL OR 17X24 6PK STRL BLUE (TOWEL DISPOSABLE) ×2 IMPLANT
TRAY FOLEY W/BAG SLVR 14FR LF (SET/KITS/TRAYS/PACK) ×2 IMPLANT
WATER STERILE IRR 1000ML POUR (IV SOLUTION) ×2 IMPLANT

## 2018-08-14 NOTE — Anesthesia Postprocedure Evaluation (Signed)
Anesthesia Post Note  Patient: [name]  Procedure(s) Performed: CESAREAN SECTION (N/A )     Patient location during evaluation: Mother Baby Anesthesia Type: Combined Spinal/Epidural Level of consciousness: oriented and awake and alert Pain management: pain level controlled Vital Signs Assessment: post-procedure vital signs reviewed and stable Respiratory status: spontaneous breathing and respiratory function stable Cardiovascular status: blood pressure returned to baseline and stable Postop Assessment: no headache, no backache, no apparent nausea or vomiting and able to ambulate Anesthetic complications: no    Last Vitals:  Vitals:   08/14/18 1700 08/14/18 1715  BP: 93/64 97/61  Pulse: 79 73  Resp: (!) 21 16  Temp:  36.9 C  SpO2: 93% 100%    Last Pain:  Vitals:   08/14/18 1715  TempSrc: Oral  PainSc: 0-No pain   Pain Goal:                Epidural/Spinal Function Cutaneous sensation: Tingles (08/14/18 1715), Patient able to flex knees: Yes (08/14/18 1715), Patient able to lift hips off bed: No (08/14/18 1715), Back pain beyond tenderness at insertion site: No (08/14/18 1715), Progressively worsening motor and/or sensory loss: No (08/14/18 1715), Bowel and/or bladder incontinence post epidural: No (08/14/18 1715)  Lori Watts

## 2018-08-14 NOTE — Transfer of Care (Signed)
Immediate Anesthesia Transfer of Care Note  Patient: Lori Watts  Procedure(s) Performed: CESAREAN SECTION (N/A )  Patient Location: PACU  Anesthesia Type:Spinal  Level of Consciousness: awake, alert  and oriented  Airway & Oxygen Therapy: Patient Spontanous Breathing and Patient connected to nasal cannula oxygen  Post-op Assessment: Report given to RN and Post -op Vital signs reviewed and stable  Post vital signs: Reviewed and stable  Last Vitals:  Vitals Value Taken Time  BP    Temp    Pulse 99 08/14/2018  3:43 PM  Resp 18 08/14/2018  3:43 PM  SpO2 92 % 08/14/2018  3:43 PM  Vitals shown include unvalidated device data.  Last Pain:  Vitals:   08/14/18 1020  TempSrc: Oral         Complications: No apparent anesthesia complications

## 2018-08-14 NOTE — Lactation Note (Signed)
This note was copied from a baby's chart. Lactation Consultation Note  Patient Name: Lori Watts Date: 08/14/2018 Reason for consult: Initial assessment;Term  2021 - 2053 - I visited Lori Watts to conduct initial breast feeding education and to assist with breast feeding as needed. Lori Watts states that she has attempted to breast feed her son, Lori Watts, several times since delivery. However, he has been sleepy and has not achieved a strong latch.  Mom has experience breast feeding her previous children. She indicates that she does not how to hand express. I educated on day one infant feeding patterns and encouraged her to feed baby on demand 8-12 times a day and to wake baby to feed if baby has not cued within 3 hours of the previous feed.  Lori Watts requested that we attempt to feed baby. When I unswaddled him, he had a wet and dirty meconium diaper (large). I changed him and changed out his blankets. He then began to spit up and choke, and I used the bulb syringe to help him clear. He appeared calm after a few moments.  I then placed baby skin to skin with mom. She requested that he be in cradle hold on the left breast. He appeared too sleepy to latch. I encouraged mom to hand express today and to feed any expressed milk to baby by finger or spoon. I encouraged Lori Watts to call us as needed for lactation support.  Lori Watts verbalized understanding. I educated on feeding frequency and duration and output expectations. I also shared gave her our breast feeding resource handouts.  Maternal Data Has patient been taught Hand Expression?: Yes Does the patient have breastfeeding experience prior to this delivery?: Yes  Feeding Feeding Type: Bottle Fed - Formula Nipple Type: Slow - flow  LATCH Score Latch: Too sleepy or reluctant, no latch achieved, no sucking elicited.  Audible Swallowing: None  Type of Nipple: Everted at rest and after stimulation  Comfort  (Breast/Nipple): Soft / non-tender  Hold (Positioning): Assistance needed to correctly position infant at breast and maintain latch.  LATCH Score: 5  Interventions Interventions: Breast feeding basics reviewed;Assisted with latch;Skin to skin;Breast compression;Adjust position   Consult Status Consult Status: Follow-up Date: 08/15/18 Follow-up type: In-patient    Walker Shadow 08/14/2018, 9:04 PM

## 2018-08-14 NOTE — Interval H&P Note (Signed)
History and Physical Interval Note:  08/14/2018 12:19 PM  Lori Watts  has presented today for surgery, with the diagnosis of Prior Cesarean Section.  The various methods of treatment have been discussed with the patient and family. After consideration of risks, benefits and other options for treatment, the patient has consented to  Procedure(s): CESAREAN SECTION (N/A) as a surgical intervention.  The patient's history has been reviewed, patient examined, no change in status, stable for surgery.  I have reviewed the patient's chart and labs.  Questions were answered to the patient's satisfaction.     [name]

## 2018-08-14 NOTE — Anesthesia Procedure Notes (Signed)
Spinal  Patient location during procedure: OB Start time: 08/14/2018 12:39 PM End time: 08/14/2018 12:47 PM Staffing Anesthesiologist: Trevor Iha, MD Performed: anesthesiologist  Preanesthetic Checklist Completed: patient identified, surgical consent, pre-op evaluation, timeout performed, IV checked, risks and benefits discussed and monitors and equipment checked Spinal Block Patient position: sitting Prep: site prepped and draped and DuraPrep Patient monitoring: heart rate, cardiac monitor, continuous pulse ox and blood pressure Approach: midline Location: L3-4 Injection technique: single-shot Needle Needle type: Pencan  Needle gauge: 24 G Needle length: 10 cm Needle insertion depth: 7 cm Catheter at skin depth: 11 cm Assessment Sensory level: T4 Additional Notes 2 Attempt (s). Pt tolerated procedure well.

## 2018-08-14 NOTE — Op Note (Signed)
Preoperative diagnosis: Intrauterine pregnancy at 39 weeks and 2 days, 4 previous cesarean sections  Post operative diagnosis: Same with severe pelvic adhesions  Anesthesia: Combined Spinal and Epidural  Anesthesiologist: Dr. Richardson LandryHouser  Procedure: Repeat low transverse cesarean section  Surgeon: Dr. Dois DavenportSandra Debrina Kizer  Assistant: Henreitta LeberElmira Powell PA-C  Quantitated blood loss: 1125 cc  Procedure:  After being informed of the planned procedure and possible complications including bleeding, infection, injury to other organs, informed consent is obtained. The patient is taken to OR #B and given combined spinal and epidural anesthesia without complication. She is placed in the dorsal decubitus position with the pelvis tilted to the left. She is then prepped and draped in a sterile fashion. A Foley catheter is inserted in her bladder.  After assessing adequate level of anesthesia, we infiltrate the suprapubic area with 20 cc of Marcaine 0.25 and perform a Pfannenstiel incision which is brought down sharply to the fascia. The fascia is entered in a low transverse fashion.   Linea alba is carefully dissected up and down with extreme difficulty. During the dissection, we reach the myometrium. Systematically using traction and scissors, we attempt to detach the uterus from the abdominal wall to which it is densely adherent.We succeed in recognizing the upper 1/2 of the uterus by palpation. We are unable to free the uterus towards the pelvis due to "frozen pelvis". The upper dissection, having left us with a significant breach of the uterine serosa and myometrium, offered the only option for uterine entry, approximately mid-uterus.   The myometrium is then entered in a high transverse fashion ; first with knife and then extended bluntly. The myometrium was entered at the lower edge of the placenta. Amniotic fluid is clear. We assist the birth of a female  infant in vertex presentation using a Mushroom vacuum. 2  nuchal cord loops are reduced. The baby is delivered. The cord is clamped and sectioned. The baby is given to the neonatologist present in the room.  10 cc of blood is drawn from the umbilical vein.The placenta is allowed to deliver spontaneously. It is complete and the cord has 3 vessels. Uterine revision is negative.  We proceed with closure of the myometrium with a running locked suture of 0 Vicryl. It is impossible to proceed with a 2nd layer of closure. We then proceed with repair of the myometrial and serosal defect left by the entry dissection. Over the next 90 minutes, 11 sutures of 0 Vicryl, Gelfoam in the right corner and Aristat over the serosal defect, we achieve a satisfactory hemostasis.   We are unable to evaluate tubes and ovaries.The bladder was never seen but urine remained clear throughout the procedure. With decrease in blood pressure and new tachycardia, transfusion was recommended to the patient who declined. Aggressive fluid management and 2 units of Albumin were given to the patient.  Retractors and sponges are removed. Under fascia hemostasis is completed with cauterization. The fascia is then closed with 2 running sutures of 0 Vicryl meeting midline. The wound is irrigated with warm saline and hemostasis is completed with cauterization. We close the subcuticular layer with 2-0 Plain.The skin is closed with a subcuticular suture of 3-0 Monocryl and Steri-Strips.  Instrument and sponge count is complete x2. Quantitated blood loss is 1125 cc.  The procedure is well tolerated by the patient who is taken to recovery room in a well and stable condition.   The patient was updated throughout and at completion  of the procedure. Reviewed need for  close observation during post-op period with risks of post-operative bleeding. I recommend NOT to consider another pregnancy as this could be life threatening to both mother and child.  female baby named Sherrie Sport was born at 13:33 and received an  Apgar of 8  at 1 minute and 9 at 5 minutes.    Specimen: Placenta sent to L & D   Crist Fat Jamiria Langill MD 5/21/20203:36 PM

## 2018-08-15 LAB — BPAM RBC
Blood Product Expiration Date: 202006012359
Blood Product Expiration Date: 202006082359
ISSUE DATE / TIME: 202005211230
ISSUE DATE / TIME: 202005212307
Unit Type and Rh: 6200
Unit Type and Rh: 6200

## 2018-08-15 LAB — TYPE AND SCREEN
ABO/RH(D): A POS
Antibody Screen: NEGATIVE
Unit division: 0
Unit division: 0

## 2018-08-15 LAB — NO BLOOD PRODUCTS

## 2018-08-15 LAB — RPR: RPR Ser Ql: NONREACTIVE

## 2018-08-15 LAB — CBC
HCT: 15.5 % — ABNORMAL LOW (ref 36.0–46.0)
Hemoglobin: 5.2 g/dL — CL (ref 12.0–15.0)
MCH: 27.8 pg (ref 26.0–34.0)
MCHC: 33.5 g/dL (ref 30.0–36.0)
MCV: 82.9 fL (ref 80.0–100.0)
Platelets: 162 10*3/uL (ref 150–400)
RBC: 1.87 MIL/uL — ABNORMAL LOW (ref 3.87–5.11)
RDW: 14.5 % (ref 11.5–15.5)
WBC: 13.7 10*3/uL — ABNORMAL HIGH (ref 4.0–10.5)
nRBC: 0 % (ref 0.0–0.2)

## 2018-08-15 LAB — ABO/RH: ABO/RH(D): A POS

## 2018-08-15 MED ORDER — KETOROLAC TROMETHAMINE 10 MG PO TABS
10.0000 mg | ORAL_TABLET | Freq: Four times a day (QID) | ORAL | Status: DC | PRN
  Administered 2018-08-15 – 2018-08-18 (×11): 10 mg via ORAL
  Filled 2018-08-15 (×11): qty 1

## 2018-08-15 MED ORDER — SODIUM CHLORIDE 0.9 % IV SOLN
510.0000 mg | Freq: Once | INTRAVENOUS | Status: AC
  Administered 2018-08-15: 510 mg via INTRAVENOUS
  Filled 2018-08-15: qty 17
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
## 2018-08-15 NOTE — Progress Notes (Signed)
MD alerted for critical lab value of 5.2 hgb.

## 2018-08-15 NOTE — Lactation Note (Signed)
This note was copied from a baby's chart. Lactation Consultation Note  Patient Name: Lori Watts [name] YQMGN'O Date: 08/15/2018 Reason for consult: Follow-up assessment;Term Mom called out for latch assist.  Baby sleeping..  Waking techniques done.  Mom is having abdominal discomfort and can't sit up in the bed.  Baby placed in football hold on right side.Hand expression done with one small drop of colostrum visible.  Baby remains sleepy and does not open mouth but sucks his tongue.  Baby has a strong suck on finger.  Baby finally opened enough to latch and suck for a few minutes before falling asleep.  Instructed to feed with cues and call for assist prn.  Maternal Data    Feeding Feeding Type: Breast Fed  LATCH Score Latch: Repeated attempts needed to sustain latch, nipple held in mouth throughout feeding, stimulation needed to elicit sucking reflex.  Audible Swallowing: None  Type of Nipple: Everted at rest and after stimulation  Comfort (Breast/Nipple): Soft / non-tender  Hold (Positioning): Assistance needed to correctly position infant at breast and maintain latch.  LATCH Score: 6  Interventions    Lactation Tools Discussed/Used     Consult Status Consult Status: Follow-up Date: 08/16/18 Follow-up type: In-patient    Huston Foley 08/15/2018, 12:44 PM

## 2018-08-15 NOTE — Progress Notes (Signed)
RN attempted to ambulate patient to the restroom to provide peri-care. Orthostatic vitals attempted- 105/71, HR 72 (lying), 126/71, HR 94(sitting), patient was then unable to tolerate standing for blood pressure. Patient feeling dizzy and lightheaded. RN will attempt again at 0500.   Melvern Banker RN

## 2018-08-15 NOTE — Progress Notes (Addendum)
Previous RN changed patient's honeycomb and pressure dressing on previous shift due to bleeding/leakage. During shift report at 0715 a small section of the new pressure dressing had some saturation. It was marked with both RNs present. The site marked has not changed during this shift. Vaginal bleeding has remained scant to small throughout the shift. Critical hgb of 5.2 was called to physician at 0745. MD and RN discussed blood transfusion and iron transfusion with patient at 1000. Patient declined blood transfusion and consent signed. A dose of IV ferraheme was transfused. Orthostatic vitals repeated this afternoon. Vital signs are as followed:   Lying: 121/68 Pulse 108 Sitting: 129/73 Pulse 120 Standing: 135/103 Pulse 136  Patient was short of breath and shaking. Patient was able to ambulate to chair and sit up. Pericare, foley care and a full linen change was performed during this time as well. MD stated it is ok to leave foley catheter in until able to ambulate. Repeat CBC in AM

## 2018-08-15 NOTE — Progress Notes (Addendum)
RN entered pt room to hang new bag of IV fluids and patient requested a fresh bed sheet due to blood on hers. RN noted 3 large blood spots. RN found that pressure dressing was leaking large amounts of blood from the right side. RN called midwife Waynette Buttery who ordered for the honeycomb and pressure dressing to be changed. RN and charge nurse attempted to change dressing but found that the left side was continuing to ooze blood even after holding pressure for 5 minutes. RN called midwife to come assess. Midwife held pressure and felt comfortable to go ahead and apply the pressure dressing. Patient instructed to remain laid back and not to try to get out of bed for 4 hours. RN will continue to monitor.   Melvern Banker RN

## 2018-08-15 NOTE — Progress Notes (Addendum)
Subjective: Postpartum Day 1: Cesarean Delivery Patient reports incisional pain and tolerating PO.  Called by RN that patient had saturated dressing. On assessment, honeycomb was saturated over right corner but patient did not appear to be actively bleeding. Pad and sheets beneath patient had saturated area of blood. Per RN, the bleeding was worse before I arrived to room. RN replaced pressure dressing and patient remained without active bleeding. Patient reported dizziness on ambulating. Discussed recommendation of blood transfusion with patient including risks and benefits but patient declined stating she would not accept one unless her life was in danger.   Objective: Vitals:   08/14/18 1859 08/14/18 2002 08/14/18 2100 08/14/18 2358  BP: 95/74 105/79 103/77   Pulse: 76 78 81   Resp: 18 18 16 18   Temp: 97.7 F (36.5 C) 97.8 F (36.6 C) 98.4 F (36.9 C)   TempSrc: Axillary Oral Oral   SpO2: 100% 100% 100%   Weight:      Height:        Physical Exam:  General: alert and cooperative Lochia: appropriate Uterine Fundus: firm Incision: healing well, no dehiscence, no significant erythema, sanguinous drainage present to right corner of honeycomb, approximately 15% of area  DVT Evaluation: No evidence of DVT seen on physical exam. Negative Homan's sign. No cords or calf tenderness. No significant calf/ankle edema.  Recent Labs    08/14/18 1031 08/14/18 1858  HGB 10.9* 7.7*  HCT 32.6* 23.2*    Assessment/Plan: Status post Cesarean section. Doing well postoperatively.  Continue current care. Dr. Sallye Ober to review CBC results with patient in am and discuss transfusion further if warranted   Janeece Riggers 08/15/2018, 4:39 AM  Attestation of Attending Supervision of Advanced Practitioner (CNM/NP): Evaluation and management procedures were performed by the Advanced Practitioner under my supervision and collaboration.  I have reviewed the Advanced Practitioner's note and chart, and I  agree with the management and plan.   I saw and examined patient at bedside and agree with above findings, assessment and plan as outlined above by CNM Kathalene Frames.  Patient refused a blood transfusion for religious reasons, she reports she is of "[employer] bapt[employer]" faith and not a Jehova's witness.  She understands risks of critically low Hgb  Including but not limited to cardiac risks.  She desires recheck of CBC in AM and reassessment.  -SHe desires circumcision of her son to be done.  I discussed with her risks, benefits and alternatives of circumcision including risks of bleeding, infection, damage to organs and need for further procedures.  All her questions were answered and she consented for the procedure to be done on her son.    08/15/2018.

## 2018-08-15 NOTE — Lactation Note (Signed)
This note was copied from a baby's chart. Lactation Consultation Note  Patient Name: Lori Watts [id number] Date: 08/15/2018 Reason for consult: Follow-up assessment Baby is 27 hours old.3% weight loss.  Mom chooses to breast and bottle feed.  Discussed supply and demand and instructed to put baby to breast prior to giving formula.  Mom states she has had pain with latch.  Instructed to watch for feeding cues and call for latch assist.  Baby is currently getting hearing screen done.  Maternal Data    Feeding Feeding Type: Bottle Fed - Formula Nipple Type: Slow - flow  LATCH Score                   Interventions    Lactation Tools Discussed/Used     Consult Status Consult Status: Follow-up Date: 08/16/18 Follow-up type: In-patient    Huston Foley 08/15/2018, 10:54 AM

## 2018-08-16 LAB — CBC
HCT: 15.7 % — ABNORMAL LOW (ref 36.0–46.0)
Hemoglobin: 5.2 g/dL — CL (ref 12.0–15.0)
MCH: 28.6 pg (ref 26.0–34.0)
MCHC: 33.1 g/dL (ref 30.0–36.0)
MCV: 86.3 fL (ref 80.0–100.0)
Platelets: 193 10*3/uL (ref 150–400)
RBC: 1.82 MIL/uL — ABNORMAL LOW (ref 3.87–5.11)
RDW: 14.8 % (ref 11.5–15.5)
WBC: 17.6 10*3/uL — ABNORMAL HIGH (ref 4.0–10.5)
nRBC: 0.1 % (ref 0.0–0.2)

## 2018-08-16 NOTE — Progress Notes (Signed)
RN called midwife regarding the patient's dizziness when getting up. Per dayshift report the foley was not to be pulled until the am. The Midwife stated to go ahead and pull the foley.   The Rn has ambulated the patient twice to the bathroom with assistance.

## 2018-08-16 NOTE — Progress Notes (Signed)
In to meet pt.  Pt reports she is feeling better, ambulated to bathroom without dizziness.  She does report a headache.  Bleeding is getting better.  C/o gas pain. Pt states she would only take a blood transfusion if it was life threatening.  She asked about side effects/risks of blood transfusion and length of process.  She would like to get another iron infusion before discharge b/c she does not want to go through getting another IV.  Temp:  [98.2 F (36.8 C)-98.4 F (36.9 C)] 98.2 F (36.8 C) (05/22 2112) Pulse Rate:  [98-101] 101 (05/22 2112) Resp:  [18] 18 (05/22 2112) BP: (104-118)/(62-66) 118/66 (05/22 2112) SpO2:  [95 %-100 %] 95 % (05/22 2050)   Gen:  NAD, sleepy Abd:  Pressure dressing still in place, clean Ext:  No edema or calf tenderness.  SCDs off.  Placed SCDs on to prevent DVT.  D/w pt R/B of blood transfusion and discussed that iron infusion was not for acute anemia.  Pt verbalized understanding. Informed her headache is likely due to severe anemia.  Pt will not likely be able to get iron infusion before discharge b/c it's too soon.  Will check with pharmacist.  Encouraged ambulation in hall, with assistance and not dizzy, to help with gas pain.  Anticipate discharge tomorrow.  CNM updated.

## 2018-08-16 NOTE — Lactation Note (Signed)
This note was copied from a baby's chart. Lactation Consultation Note  Patient Name: Lori Watts [id number] Date: 08/16/2018 Reason for consult: Follow-up assessment;Term Baby is 48 hours old/3% weight loss.  Mom with anemia but refuses blood transfusion.  She states she puts baby to breast before giving formula supplementation.  She states "I really want to breastfeed."  Recommended pumping with the DEBP but mom states she would like to wait until later when she is feeling better.  Instructed to notify nurse when she is ready to pump.  Encouraged to call for assist prn.  Maternal Data    Feeding Feeding Type: Bottle Fed - Formula  LATCH Score                   Interventions    Lactation Tools Discussed/Used     Consult Status Consult Status: Follow-up Date: 08/17/18 Follow-up type: In-patient    Huston Foley 08/16/2018, 2:09 PM

## 2018-08-16 NOTE — Progress Notes (Signed)
Subjective: Postpartum Day 2: Cesarean Delivery Patient reports incisional pain and tolerating PO.  Patient reports no longer feeling dizzy unless she stands up too fast but she appears fatigued. Again recommended blood transfusion to patient after explaining today's Hbg results and patient declined but states she is thinking about it maybe tomorrow.   Objective: Vitals:   08/15/18 1032 08/15/18 1430 08/15/18 2050 08/15/18 2112  BP: 111/61 104/62  118/66  Pulse: 96 98  (!) 101  Resp: 20 18  18   Temp: 98.5 F (36.9 C) 98.4 F (36.9 C)  98.2 F (36.8 C)  TempSrc: Oral Oral  Oral  SpO2: 100% 100% 95%   Weight:      Height:        Physical Exam:  General: alert and cooperative Lochia: appropriate Uterine Fundus: firm Incision: healing well, no dehiscence, no significant erythema, pressure dressing clean dry and intact  DVT Evaluation: No evidence of DVT seen on physical exam. Negative Homan's sign. No cords or calf tenderness. No significant calf/ankle edema.  Recent Labs    08/15/18 0616 08/16/18 0557  HGB 5.2* 5.2*  HCT 15.5* 15.7*    Assessment/Plan: Status post Cesarean section. Doing well postoperatively. Plan for discharge home tomorrow if patient is stable. Continue PO Iron BID for symptomatic anemia.   Janeece Riggers 08/16/2018, 11:06 AM

## 2018-08-17 ENCOUNTER — Encounter (HOSPITAL_COMMUNITY): Payer: Self-pay | Admitting: Obstetrics and Gynecology

## 2018-08-17 NOTE — Progress Notes (Signed)
Lori Watts [id number] Postpartum Postoperative Day # [address] [place], [id number], [redacted]w[redacted]d, S/P Repeatx6 LT Cesarean Section due to % prior CS.   Subjective: Patient up ad lib, denies syncope. Endorse fatigue with dizziness, most likely due to anemia, last HGB was 5.2 down from 7.7 which was post op hgb. Starting hgb was 10.9. QBL from CS and post op was totalled at 1500. Pt refuses and declines blood again, but feeling dizzy and unsafe to go home. Pt stated she can not get blood due to her religion. Pt denies SOB, or CP, no heart palpitations. Pt endorses wanting to state in house, and receive another Iron transfusion on Monday which will be three days her last one. Pt remain on fall precaution. Pt endorses knowing her risk and benefit of not receiving blood.  Reports consuming regular diet without issues and denies N/V. Patient reports 0 bowel movement + passing flatus.  Denies issues with urination and reports bleeding is "lighter."  Patient is breast and bottle feeding and reports going well.  Desires unknown for postpartum contraception.  Pain is being appropriately managed with use of po meds.   Objective: Patient Vitals for the past 24 hrs:  BP Temp Temp src Pulse Resp SpO2  08/17/18 0630 116/74 98.3 F (36.8 C) Oral - 16 -  08/16/18 2019 111/68 98.2 F (36.8 C) Oral 98 16 -  08/16/18 1542 119/66 98.8 F (37.1 C) Oral 99 16 100 %     Physical Exam:  General: alert, cooperative, appears stated age and no distress Mood/Affect: Flat Lungs: clear to auscultation, no wheezes, rales or rhonchi, symmetric air entry.  Heart: normal rate, regular rhythm, normal S1, S2, no murmurs, rubs, clicks or gallops. Breast: breasts appear normal, no suspicious masses, no skin or nipple changes or axillary nodes. Abdomen:  + bowel sounds, soft, non-tender Incision: healing well, no significant drainage, no dehiscence, no significant erythema, Honeycomb dressing  Uterine Fundus: firm, involution  -1 Lochia: appropriate Skin: Warm, Dry. DVT Evaluation: No evidence of DVT seen on physical exam. Negative Homan's sign. No cords or calf tenderness. No significant calf/ankle edema.  Labs: Recent Labs    08/14/18 1858 08/15/18 0616 08/16/18 0557  HGB 7.7* 5.2* 5.2*  HCT 23.2* 15.5* 15.7*  WBC 18.4* 13.7* 17.6*    CBG (last 3)  No results for input(s): GLUCAP in the last 72 hours.   I/O: I/O last 3 completed shifts: In: 840 [P.O.:840] Out: 2550 [Urine:2550]   Assessment Postpartum Postoperative Day # 3. Lori Watts, [id number], [redacted]w[redacted]d, S/P Repeat LT Cesarean Section due to prior x5 CS.  Pt stable. -1 Involution. Breast and bottle Feeding. Hemodynamically pt had QBL of 1500 during and post op surgery, starting hgb was 10.9, post op was 7.7, then post PP loss was 5.2, received Iron infusion on the 5/22, and declined blood products. Pt feeling dizzy and wanting to stay until tomorrow to receive second iron infusion.   Plan: Continue other mgmt as ordered VTE Prophylactics: SCD, ambulated as tolerates.  Pain control: Motrin/Tylenol/Narcotics PRN Education given regarding options for contraception, including barrier methods, injectable contraception, IUD placement, oral contraceptives.  Plan for discharge tomorrow, Breastfeeding and Lactation consult  Dr. Dion Body to be updated on patient status  [facility] NP-C, CNM 08/17/2018, 7:52 AM

## 2018-08-17 NOTE — Progress Notes (Signed)
Pt requested to stay another day for iron infusion.  D/w insurance RN, Marcelino Duster, and she stated pt can stay 4 days post op without other medical indication.  In to tell pt.  Pt reports SOB and dizziness with ambulation.  Has a headache.  Praying for guidance b/c she feels if she got a blood transfusion, she would feel "really bad" spiritually.  She reports lochia has stopped.  Temp:  [98.2 F (36.8 C)-98.8 F (37.1 C)] 98.3 F (36.8 C) (05/24 0630) Pulse Rate:  [98-99] 98 (05/23 2019) Resp:  [16] 16 (05/24 0630) BP: (111-119)/(66-74) 116/74 (05/24 0630) SpO2:  [100 %] 100 % (05/23 1542)   Gen: Pt appears lethargic, squinting eyes. Ext:  SCDs operating.  Offered 1 unit PRBCs if desired.  If not, anticipate 2nd iron infusion tomorrow.  CNM updated.

## 2018-08-18 MED ORDER — FERROUS SULFATE 325 (65 FE) MG PO TABS: 325.0000 mg | ORAL_TABLET | Freq: Three times a day (TID) | ORAL | 3 refills | Status: AC

## 2018-08-18 MED ORDER — SODIUM CHLORIDE 0.9 % IV SOLN
510.0000 mg | Freq: Once | INTRAVENOUS | Status: AC
  Administered 2018-08-18: 510 mg via INTRAVENOUS
  Filled 2018-08-18: qty 17

## 2018-08-18 MED ORDER — IBUPROFEN 800 MG PO TABS: 800.0000 mg | ORAL_TABLET | Freq: Three times a day (TID) | ORAL | 0 refills | Status: AC | PRN

## 2018-08-18 NOTE — Lactation Note (Signed)
This note was copied from a baby's chart. Lactation Consultation Note  Patient Name: Lori Watts CLEXN'T Date: 08/18/2018 Reason for consult: Follow-up assessment;Mother's request;Term(Maternal Acuity - anemia)  1018 - 1051 - I visited Ms. Perezgarcia to assist her with breast feeding. She states that she was told by her provider to not use a breast pump due to anemia. She states that she must wait 24 hours after her iron infusion today to use her breast pump. Mom is breast feeding baby and hand expressing. Baby "Sherrie Sport" is also formula feeding.  Mom states that she wants baby to receive breast milk because she knows it's very good for him. She states that her breasts are tingling and filling. I palpated and concurred that her breasts were filling.  Ms. Mccleave's RN followed up with the OB regarding pumping, and the OB indicated it was safe to pump. I brought mom a manual pump and showed her how to set it up, use it, and clean it. I recommended that she feed baby on demand and then to pump for 10 minutes on each side. Mom is interested in increasing her milk volume; I recommended that she pump at least 8 times a day.  Mom and dad plan to contact [facility] regarding eligibility for a DEBP. She has not ordered one, and I recommended that she try to obtain one. I also indicated that she could rent one from the hospital. Mom states that she prefers to use her manual pump because the DEBP "hurts" her nipples. I discussed turning down her pump pressure if this is the case.  Mom will follow up with Dr. Inocente Salles tomorrow. She then will go with her family to [place]. She has two residences, and [place] She will be back and forth. She is interested in OP consultation. I put in a referral for OP here at [facility], and I provided mom with some resources for finding a Advertising copywriter in [place].  The plan is for mom to breast feed baby 8-12 times a day and to pump  every three hours post feeding. We reviewed milk storage guidelines, and I recommended that as mom's milk volume increases that she feed her expressed breast milk to baby as supplementation.  Mom seemed relieved that her breasts were filling. She has been concerned about her anemia and whether her milk would come in.  No further questions at this time. I reviewed breast feeding resources, including virtual support groups.   Maternal Data Formula Feeding for Exclusion: No Has patient been taught Hand Expression?: Yes Does the patient have breastfeeding experience prior to this delivery?: Yes  Feeding    LATCH Score                   Interventions Interventions: Breast feeding basics reviewed;Hand pump(pump and feeding plan)  Lactation Tools Discussed/Used Pump Review: Setup, frequency, and cleaning;Milk Storage Initiated by:: hl Date initiated:: 08/18/18   Consult Status Consult Status: Follow-up Follow-up type: Out-patient    Walker Shadow 08/18/2018, 11:14 AM

## 2018-08-18 NOTE — Discharge Summary (Signed)
OB Discharge Summary     Patient Name: Lori Watts DOB: 26-Apr-1970 MRN: [id number]  Date of admission: 08/14/2018 Delivering MD: Silverio LayIVARD, SANDRA   Date of discharge: 08/18/2018  Admitting diagnosis: Prior Cesarean Section Intrauterine pregnancy: 1314w6d     Secondary diagnosis:  Active Problems:   Cesarean delivery delivered  Additional problems: Blood loss anemia     Discharge diagnosis: Term Pregnancy Delivered                                                                                                Post partum procedures:IV iron  Augmentation: NA  Complications: None  Hospital course:  Sceduled C/S   48 y.o. yo [id number] at 6914w6d was admitted to the hospital 08/14/2018 for scheduled cesarean section with the following indication:Elective Repeat.  Membrane Rupture Time/Date: 1:33 PM ,08/14/2018   Patient delivered a Viable infant.08/14/2018  Details of operation can be found in separate operative note.  Pateint had an uncomplicated postpartum course.  She is ambulating, tolerating a regular diet, passing flatus, and urinating well. Patient is discharged home in stable condition on  08/18/18         Physical exam  Vitals:   08/17/18 0630 08/17/18 1600 08/17/18 2140 08/18/18 0523  BP: 116/74 114/71 115/71 108/64  Pulse:  86 91 75  Resp: 16 16 18 16   Temp: 98.3 F (36.8 C) 98.7 F (37.1 C) 97.9 F (36.6 C) 98.2 F (36.8 C)  TempSrc: Oral Oral Oral Oral  SpO2:  100%  100%  Weight:      Height:       General: alert, cooperative and no distress Lochia: appropriate Uterine Fundus: firm Incision: Dressing is clean, dry, and intact DVT Evaluation: No evidence of DVT seen on physical exam. Labs: Lab Results  Component Value Date   WBC 17.6 (H) 08/16/2018   HGB 5.2 (LL) 08/16/2018   HCT 15.7 (L) 08/16/2018   MCV 86.3 08/16/2018   PLT 193 08/16/2018   CMP Latest Ref Rng & Units 04/10/2013  Glucose 70 - 99 mg/dL 78  BUN 6 - 23 mg/dL 10  Creatinine 9.520.50 -  1.10 mg/dL 8.410.84  Sodium 324137 - 401147 mEq/L 141  Potassium 3.7 - 5.3 mEq/L 4.3  Chloride 96 - 112 mEq/L 102  CO2 19 - 32 mEq/L 26  Calcium 8.4 - 10.5 mg/dL 9.2  Total Protein 6.0 - 8.3 g/dL 7.0  Total Bilirubin 0.3 - 1.2 mg/dL 0.4  Alkaline Phos 39 - 117 U/L 129(H)  AST 0 - 37 U/L 24  ALT 0 - 35 U/L 31    Discharge instruction: per After Visit Summary and "Baby and Me Booklet".  After visit meds:  Ibuprofen and Ferrous sulfate and PNV  Diet: routine diet  Activity: Advance as tolerated. Pelvic rest for 6 weeks.   Outpatient follow up:2 weeks Follow up Appt:No future appointments. Follow up Visit:No follow-ups on file.  Postpartum contraception: None  Newborn Data: Live born female  Birth Weight: 8 lb 7.1 oz (3830 g) APGAR: 8, 9  Newborn Delivery   Birth date/time:  08/14/2018 13:33:00 Delivery  type:  C-Section, Vacuum Assisted Trial of labor:  No C-section categorization:  Repeat     Baby Feeding: Breast Disposition:home with mother   08/18/2018 Kenney Houseman, CNM

## 2018-11-11 ENCOUNTER — Encounter (HOSPITAL_COMMUNITY): Payer: Self-pay
